# Patient Record
Sex: Female | Born: 1950
Health system: Southern US, Community
[De-identification: ages and names within clinical notes are randomized; demographics above are authoritative.]

## PROBLEM LIST (undated history)

## (undated) DIAGNOSIS — E079 Disorder of thyroid, unspecified: Secondary | ICD-10-CM

## (undated) DIAGNOSIS — F419 Anxiety disorder, unspecified: Secondary | ICD-10-CM

## (undated) DIAGNOSIS — E785 Hyperlipidemia, unspecified: Secondary | ICD-10-CM

## (undated) DIAGNOSIS — K219 Gastro-esophageal reflux disease without esophagitis: Secondary | ICD-10-CM

## (undated) DIAGNOSIS — T7840XA Allergy, unspecified, initial encounter: Secondary | ICD-10-CM

## (undated) HISTORY — DX: Disorder of thyroid, unspecified: E07.9

## (undated) HISTORY — PX: MANDIBLE FRACTURE SURGERY: SHX706

## (undated) HISTORY — DX: Allergy, unspecified, initial encounter: T78.40XA

## (undated) HISTORY — DX: Hyperlipidemia, unspecified: E78.5

## (undated) HISTORY — PX: ESOPHAGOGASTRODUODENOSCOPY: SHX1529

## (undated) HISTORY — DX: Gastro-esophageal reflux disease without esophagitis: K21.9

## (undated) HISTORY — DX: Anxiety disorder, unspecified: F41.9

---

## 2008-06-13 ENCOUNTER — Ambulatory Visit (HOSPITAL_COMMUNITY): Admission: RE | Admit: 2008-06-13 | Discharge: 2008-06-13 | Payer: Self-pay | Admitting: Family Medicine

## 2010-04-22 ENCOUNTER — Ambulatory Visit (HOSPITAL_COMMUNITY)
Admission: RE | Admit: 2010-04-22 | Discharge: 2010-04-22 | Payer: Self-pay | Source: Home / Self Care | Attending: Family Medicine | Admitting: Family Medicine

## 2010-05-04 ENCOUNTER — Ambulatory Visit
Admission: RE | Admit: 2010-05-04 | Discharge: 2010-05-04 | Payer: Self-pay | Source: Home / Self Care | Attending: Gastroenterology | Admitting: Gastroenterology

## 2010-05-04 ENCOUNTER — Encounter: Payer: Self-pay | Admitting: Internal Medicine

## 2010-05-04 ENCOUNTER — Encounter: Payer: Self-pay | Admitting: Gastroenterology

## 2010-05-04 DIAGNOSIS — K219 Gastro-esophageal reflux disease without esophagitis: Secondary | ICD-10-CM | POA: Insufficient documentation

## 2010-05-09 ENCOUNTER — Encounter: Payer: Self-pay | Admitting: Family Medicine

## 2010-05-10 ENCOUNTER — Encounter: Payer: Self-pay | Admitting: Gastroenterology

## 2010-05-12 ENCOUNTER — Encounter: Payer: Self-pay | Admitting: Gastroenterology

## 2010-05-17 ENCOUNTER — Ambulatory Visit (HOSPITAL_COMMUNITY)
Admission: RE | Admit: 2010-05-17 | Discharge: 2010-05-17 | Payer: Self-pay | Source: Home / Self Care | Attending: Internal Medicine | Admitting: Internal Medicine

## 2010-05-18 NOTE — Op Note (Signed)
NAME:  Courtney Tapia, BOEHNER               ACCOUNT NO.:  [id number]  MEDICAL RECORD NO.:  [id number]          PATIENT TYPE:  AMB  LOCATION:  DAY                           FACILITY:  [facility]  PHYSICIAN:  R. Roetta Sessions, M.D. DATE OF BIRTH:  May 24, 1950  DATE OF PROCEDURE:  05/17/2010 DATE OF DISCHARGE:                              OPERATIVE REPORT   PROCEDURE:  EGD with esophageal biopsy followed by colonoscopy with biopsy.  INDICATIONS FOR PROCEDURE:  A 60 year old lady with longstanding GERD, well-controlled on omeprazole 40 mg orally daily, had some recent epigastric pain which has resolved.  She was seen in office, started on Dexilant 60 mg orally daily.  She stated it did not last throughout the night, went back to omeprazole, was doing very well.  Epigastric pain is resolved.  History of Barrett's diagnosed back in [place].  Last EGD 2005.  No dysphagia, here for surveillance.  Also has never had screening colonoscopy.  No lower GI tract symptoms.  No family history of polyps, colon, rectal neoplasia.  EGD and colonoscopy now being done. Risks, benefits, limitations, alternatives, imponderables have been discussed.  Questions have been answered and all parties agreeable. Please see documentation in the medical record.  PROCEDURE NOTE:  O2 saturation, blood pressure, pulse, respirations were monitored the entirety of both procedures.  CONSCIOUS SEDATION:  Versed 6 mg IV, Demerol 150 mg IV in divided doses. Cetacaine spray for topical pharyngeal anesthesia.  EGD findings: Examination of tubular esophagus revealed salmon-colored epithelium coming up in "geographic manner" to 32 cm from the incisors and going down to 37 cm (EG junction).  There was no esophagitis, no tumor. Tubular esophagus was patent through the EG junction.  Stomach:  Gas cavity was emptied and insufflated well with air. Thorough examination of gastric mucosa including retroflexion of proximal stomach,  esophagogastric junction demonstrated only a small-to- moderate size hiatal hernia.  Pylorus was patent, easily traversed. Examination of bulb and second portion revealed no abnormalities.  THERAPEUTIC/DIAGNOSTIC MANEUVERS PERFORMED:  Scope was withdrawn. Several biopsies at 32, 35, and 37 cm incisors were taken for histologic study.  The patient tolerated the procedure and was prepared for colonoscopy.  Digital rectal exam revealed no abnormalities.  Endoscopic findings:  Prep was adequate.  Colon:  Colonic mucosa was surveyed from the rectosigmoid junction through the left transverse right colon to the appendiceal orifice, ileocecal valve/cecum.  These structures were well seen and photographed for the record.  From this level, the scope was slowly and cautiously withdrawn.  All previously mentioned mucosal surfaces were again seen.  The patient has scattered left-sided diverticular which was a single diminutive cecal polyp which was cold biopsied/removed.  Remainder of colonic mucosa appeared unremarkable. The scope was pulled down to the rectum where a thorough examination of rectal mucosa including retroflexed view of the anal verge demonstrated only some internal hemorrhoids.  The patient tolerated this procedure well.  Cecal withdrawal time 10 minutes.  IMPRESSION: 1. EGD salmon-colored epithelium in distal esophagus consistent with     prior diagnosis of Barrett's status post segmental biopsy. 2. Hiatal hernia, otherwise normal stomach, D1 and  D2.  COLONOSCOPY FINDINGS:  Internal hemorrhoids, otherwise normal rectum and left-sided diverticula.  Diminutive cecal polyp status post cold biopsy removal.  RECOMMENDATIONS: 1. Continue omeprazole 40 mg orally daily as this is working very well     for her reflux symptoms. 2. Follow up on path. 3. Literature on reflux, diverticulosis and polyps provided to Ms.     Thane.  Further recommendations to follow.     Jonathon Bellows, M.D.     RMR/MEDQ  D:  05/17/2010  T:  05/17/2010  Job:  [id number]  cc:   Angus G. Renard Matter, MD Fax: [phone]  Electronically Signed by Lorrin Goodell M.D. on 05/18/2010 01:42:39 PM

## 2010-05-19 ENCOUNTER — Encounter: Payer: Self-pay | Admitting: Internal Medicine
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
## 2010-05-20 NOTE — Letter (Signed)
Summary: AUTH FOR RELEASE OF MEDICAL INFO  AUTH FOR RELEASE OF MEDICAL INFO   Imported By: Rexene Alberts 05/10/2010 12:08:22  _____________________________________________________________________  External Attachment:    Type:   Image     Comment:   External Document

## 2010-05-20 NOTE — Letter (Signed)
Summary: EGD/TCS ORDER  EGD/TCS ORDER   Imported By: Ave Filter 05/04/2010 10:26:18  _____________________________________________________________________  External Attachment:    Type:   Image     Comment:   External Document

## 2010-05-20 NOTE — Letter (Signed)
Summary: [facility]  [facility]   Imported By: Rexene Alberts 05/12/2010 10:21:58  _____________________________________________________________________  External Attachment:    Type:   Image     Comment:   External Document

## 2010-05-20 NOTE — Letter (Signed)
Summary: REFERRAL FROM DR [facility]  REFERRAL FROM DR [facility]   Imported By: Rexene Alberts 05/04/2010 16:04:15  _____________________________________________________________________  External Attachment:    Type:   Image     Comment:   External Document

## 2010-05-26 NOTE — Letter (Addendum)
Summary: Patient Notice, Endo Biopsy Results  [facility]  [address]   [place], [place] [address]   Phone: [phone]  Fax: [phone]       May 19, 2010   Courtney Tapia [address] [place], [place]  [address] 12/13/1950    Dear Ms. Reiswig,  I am pleased to inform you that the biopsies taken during your recent endoscopic examination did not show any evidence of cancer upon pathologic examination.  You do have Barrett's esophagus.  The colon polyp was benign as well.  Additional information/recommendations:  No further action is needed at this time.  Please follow-up with your primary care physician for your other healthcare needs.  You should have a repeat endoscopic examination in 3 years.  You should have a repeat colonoscopy in 5 years.  Please call us if you are having persistent problems or have questions about your condition that have not been fully answered at this time.  Sincerely,    R. Roetta Sessions MD, FACP [facility] Ph: [phone]   Fax: [phone]   Appended Document: Patient Notice, Endo Biopsy Results mailed letter to pt  Appended Document: Patient Notice, Endo Biopsy Results reminder in epic

## 2011-05-10 ENCOUNTER — Other Ambulatory Visit (HOSPITAL_COMMUNITY): Payer: Self-pay | Admitting: Family Medicine

## 2011-05-10 DIAGNOSIS — Z139 Encounter for screening, unspecified: Secondary | ICD-10-CM

## 2011-05-13 ENCOUNTER — Ambulatory Visit (HOSPITAL_COMMUNITY)
Admission: RE | Admit: 2011-05-13 | Discharge: 2011-05-13 | Disposition: A | Discharge status: Home / Self Care | Payer: BC Managed Care – PPO | Source: Ambulatory Visit | Attending: Family Medicine | Admitting: Family Medicine

## 2011-05-13 DIAGNOSIS — Z1231 Encounter for screening mammogram for malignant neoplasm of breast: Secondary | ICD-10-CM | POA: Insufficient documentation

## 2011-05-13 DIAGNOSIS — Z139 Encounter for screening, unspecified: Secondary | ICD-10-CM

## 2012-04-16 ENCOUNTER — Other Ambulatory Visit (HOSPITAL_COMMUNITY): Payer: Self-pay | Admitting: Family Medicine

## 2012-04-16 DIAGNOSIS — Z139 Encounter for screening, unspecified: Secondary | ICD-10-CM

## 2012-05-15 ENCOUNTER — Ambulatory Visit (HOSPITAL_COMMUNITY)
Admission: RE | Admit: 2012-05-15 | Discharge: 2012-05-15 | Disposition: A | Discharge status: Home / Self Care | Payer: BC Managed Care – PPO | Source: Ambulatory Visit | Attending: Family Medicine | Admitting: Family Medicine

## 2012-05-15 DIAGNOSIS — Z1231 Encounter for screening mammogram for malignant neoplasm of breast: Secondary | ICD-10-CM | POA: Insufficient documentation

## 2012-05-15 DIAGNOSIS — Z139 Encounter for screening, unspecified: Secondary | ICD-10-CM

## 2013-05-03 ENCOUNTER — Other Ambulatory Visit (HOSPITAL_COMMUNITY): Payer: Self-pay | Admitting: Family Medicine

## 2013-05-03 DIAGNOSIS — Z139 Encounter for screening, unspecified: Secondary | ICD-10-CM

## 2013-05-17 ENCOUNTER — Ambulatory Visit (HOSPITAL_COMMUNITY)
Admission: RE | Admit: 2013-05-17 | Discharge: 2013-05-17 | Disposition: A | Discharge status: Home / Self Care | Payer: BC Managed Care – PPO | Source: Ambulatory Visit | Attending: Family Medicine | Admitting: Family Medicine

## 2013-05-17 ENCOUNTER — Other Ambulatory Visit (HOSPITAL_COMMUNITY): Payer: Self-pay | Admitting: Family Medicine

## 2013-05-17 DIAGNOSIS — M545 Low back pain, unspecified: Secondary | ICD-10-CM | POA: Insufficient documentation

## 2013-05-17 DIAGNOSIS — M79609 Pain in unspecified limb: Secondary | ICD-10-CM | POA: Insufficient documentation

## 2013-05-17 DIAGNOSIS — M79604 Pain in right leg: Secondary | ICD-10-CM

## 2013-05-17 DIAGNOSIS — M79605 Pain in left leg: Secondary | ICD-10-CM

## 2013-05-17 DIAGNOSIS — Z139 Encounter for screening, unspecified: Secondary | ICD-10-CM

## 2013-05-17 DIAGNOSIS — Z1231 Encounter for screening mammogram for malignant neoplasm of breast: Secondary | ICD-10-CM | POA: Insufficient documentation

## 2013-12-26 ENCOUNTER — Other Ambulatory Visit (HOSPITAL_COMMUNITY): Payer: Self-pay | Admitting: Family Medicine

## 2013-12-26 DIAGNOSIS — M810 Age-related osteoporosis without current pathological fracture: Secondary | ICD-10-CM

## 2013-12-31 ENCOUNTER — Other Ambulatory Visit (HOSPITAL_COMMUNITY): Payer: BC Managed Care – PPO

## 2014-07-24 ENCOUNTER — Other Ambulatory Visit (HOSPITAL_COMMUNITY): Payer: Self-pay | Admitting: Family Medicine

## 2014-07-24 DIAGNOSIS — Z1231 Encounter for screening mammogram for malignant neoplasm of breast: Secondary | ICD-10-CM

## 2014-08-06 ENCOUNTER — Ambulatory Visit (HOSPITAL_COMMUNITY): Payer: BC Managed Care – PPO

## 2014-09-19 ENCOUNTER — Ambulatory Visit (HOSPITAL_COMMUNITY)
Admission: RE | Admit: 2014-09-19 | Discharge: 2014-09-19 | Disposition: A | Discharge status: Home / Self Care | Payer: BC Managed Care – PPO | Source: Ambulatory Visit | Attending: Family Medicine | Admitting: Family Medicine

## 2014-09-19 DIAGNOSIS — Z1231 Encounter for screening mammogram for malignant neoplasm of breast: Secondary | ICD-10-CM | POA: Diagnosis not present

## 2015-05-12 ENCOUNTER — Encounter: Payer: Self-pay | Admitting: Internal Medicine

## 2015-06-30 ENCOUNTER — Encounter: Payer: Self-pay | Admitting: Family Medicine

## 2015-06-30 DIAGNOSIS — E039 Hypothyroidism, unspecified: Secondary | ICD-10-CM | POA: Insufficient documentation

## 2015-06-30 DIAGNOSIS — T7840XA Allergy, unspecified, initial encounter: Secondary | ICD-10-CM | POA: Insufficient documentation

## 2015-06-30 DIAGNOSIS — E785 Hyperlipidemia, unspecified: Secondary | ICD-10-CM | POA: Insufficient documentation

## 2015-06-30 DIAGNOSIS — F419 Anxiety disorder, unspecified: Secondary | ICD-10-CM | POA: Insufficient documentation

## 2015-07-07 ENCOUNTER — Ambulatory Visit: Payer: BC Managed Care – PPO | Admitting: Nurse Practitioner

## 2015-07-14 ENCOUNTER — Encounter: Payer: Self-pay | Admitting: Family Medicine

## 2015-07-14 ENCOUNTER — Ambulatory Visit (INDEPENDENT_AMBULATORY_CARE_PROVIDER_SITE_OTHER): Payer: BC Managed Care – PPO | Admitting: Family Medicine

## 2015-07-14 VITALS — BP 134/74 | HR 78 | Temp 98.2°F | Resp 16 | Ht 67.0 in | Wt 177.0 lb

## 2015-07-14 DIAGNOSIS — Z0000 Encounter for general adult medical examination without abnormal findings: Secondary | ICD-10-CM

## 2015-07-14 DIAGNOSIS — F419 Anxiety disorder, unspecified: Secondary | ICD-10-CM

## 2015-07-14 DIAGNOSIS — K219 Gastro-esophageal reflux disease without esophagitis: Secondary | ICD-10-CM

## 2015-07-14 DIAGNOSIS — Z13820 Encounter for screening for osteoporosis: Secondary | ICD-10-CM

## 2015-07-14 DIAGNOSIS — M25562 Pain in left knee: Secondary | ICD-10-CM | POA: Diagnosis not present

## 2015-07-14 DIAGNOSIS — R0989 Other specified symptoms and signs involving the circulatory and respiratory systems: Secondary | ICD-10-CM

## 2015-07-14 DIAGNOSIS — E785 Hyperlipidemia, unspecified: Secondary | ICD-10-CM

## 2015-07-14 DIAGNOSIS — Z1321 Encounter for screening for nutritional disorder: Secondary | ICD-10-CM

## 2015-07-14 DIAGNOSIS — G8929 Other chronic pain: Secondary | ICD-10-CM

## 2015-07-14 DIAGNOSIS — E038 Other specified hypothyroidism: Secondary | ICD-10-CM

## 2015-07-14 LAB — CBC WITH DIFFERENTIAL/PLATELET
BASOS ABS: 0 10*3/uL (ref 0.0–0.1)
Basophils Relative: 1 % (ref 0–1)
EOS PCT: 3 % (ref 0–5)
Eosinophils Absolute: 0.1 10*3/uL (ref 0.0–0.7)
HEMATOCRIT: 41.4 % (ref 36.0–46.0)
HEMOGLOBIN: 13.3 g/dL (ref 12.0–15.0)
LYMPHS ABS: 2 10*3/uL (ref 0.7–4.0)
LYMPHS PCT: 41 % (ref 12–46)
MCH: 30.2 pg (ref 26.0–34.0)
MCHC: 32.1 g/dL (ref 30.0–36.0)
MCV: 94.1 fL (ref 78.0–100.0)
MPV: 9.9 fL (ref 8.6–12.4)
Monocytes Absolute: 0.3 10*3/uL (ref 0.1–1.0)
Monocytes Relative: 6 % (ref 3–12)
NEUTROS ABS: 2.4 10*3/uL (ref 1.7–7.7)
NEUTROS PCT: 49 % (ref 43–77)
Platelets: 302 10*3/uL (ref 150–400)
RBC: 4.4 MIL/uL (ref 3.87–5.11)
RDW: 12.8 % (ref 11.5–15.5)
WBC: 4.9 10*3/uL (ref 4.0–10.5)

## 2015-07-14 LAB — COMPREHENSIVE METABOLIC PANEL
ALBUMIN: 4.2 g/dL (ref 3.6–5.1)
ALK PHOS: 72 U/L (ref 33–130)
ALT: 15 U/L (ref 6–29)
AST: 15 U/L (ref 10–35)
BILIRUBIN TOTAL: 0.8 mg/dL (ref 0.2–1.2)
BUN: 15 mg/dL (ref 7–25)
CALCIUM: 9.1 mg/dL (ref 8.6–10.4)
CO2: 27 mmol/L (ref 20–31)
Chloride: 105 mmol/L (ref 98–110)
Creat: 0.46 mg/dL — ABNORMAL LOW (ref 0.50–0.99)
GLUCOSE: 78 mg/dL (ref 70–99)
POTASSIUM: 4.3 mmol/L (ref 3.5–5.3)
Sodium: 143 mmol/L (ref 135–146)
TOTAL PROTEIN: 7.1 g/dL (ref 6.1–8.1)

## 2015-07-14 LAB — LIPID PANEL
CHOL/HDL RATIO: 2.9 ratio (ref <=5.0)
CHOLESTEROL: 224 mg/dL — AB (ref 125–200)
HDL: 77 mg/dL (ref >=46)
LDL Cholesterol: 133 mg/dL — ABNORMAL HIGH (ref <130)
Triglycerides: 71 mg/dL (ref <150)
VLDL: 14 mg/dL (ref <30)

## 2015-07-14 LAB — T4, FREE: Free T4: 1.3 ng/dL (ref 0.8–1.8)

## 2015-07-14 LAB — TSH: TSH: 3.05 m[IU]/L

## 2015-07-14 LAB — T3, FREE: T3 FREE: 3.1 pg/mL (ref 2.3–4.2)
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
## 2015-07-14 NOTE — Assessment & Plan Note (Signed)
Check thyroid function studies  

## 2015-07-14 NOTE — Assessment & Plan Note (Signed)
-

## 2015-07-14 NOTE — Assessment & Plan Note (Signed)
Continue Prilosec 20 mg.

## 2015-07-14 NOTE — Patient Instructions (Addendum)
Schedule your Bone Density  Continue current medications Referral to orthopedics for knee  We will call with lab results  Bruit on left carotid - Ultrasound to be done to evaluate  F/U pending results

## 2015-07-15 ENCOUNTER — Telehealth: Payer: Self-pay | Admitting: *Deleted

## 2015-07-15 ENCOUNTER — Encounter: Payer: Self-pay | Admitting: *Deleted

## 2015-07-15 ENCOUNTER — Other Ambulatory Visit: Payer: Self-pay | Admitting: *Deleted

## 2015-07-15 LAB — VITAMIN D 25 HYDROXY (VIT D DEFICIENCY, FRACTURES): VIT D 25 HYDROXY: 38 ng/mL (ref 30–100)

## 2015-07-15 MED ORDER — SIMVASTATIN 20 MG PO TABS: 20.0000 mg | ORAL_TABLET | Freq: Every day | ORAL | Status: DC

## 2015-07-15 NOTE — Telephone Encounter (Signed)
Pt called back and aware of appt 

## 2015-07-15 NOTE — Telephone Encounter (Signed)
Pt has appt scheduled for Carotid US at [facility] at [address] on Tues April 4 at 10:15am, pt is to arrive 9:50am, [facility] to pt for appt information

## 2015-07-21 ENCOUNTER — Ambulatory Visit
Admission: RE | Admit: 2015-07-21 | Discharge: 2015-07-21 | Disposition: A | Discharge status: Home / Self Care | Payer: BC Managed Care – PPO | Source: Ambulatory Visit | Attending: Family Medicine | Admitting: Family Medicine

## 2015-07-21 DIAGNOSIS — R0989 Other specified symptoms and signs involving the circulatory and respiratory systems: Secondary | ICD-10-CM

## 2015-07-23 ENCOUNTER — Other Ambulatory Visit: Payer: Self-pay | Admitting: Family Medicine

## 2015-07-23 ENCOUNTER — Encounter: Payer: Self-pay | Admitting: *Deleted

## 2015-07-23 DIAGNOSIS — E2839 Other primary ovarian failure: Secondary | ICD-10-CM

## 2015-08-04 ENCOUNTER — Telehealth: Payer: Self-pay | Admitting: Family Medicine

## 2015-08-04 MED ORDER — PRAVASTATIN SODIUM 20 MG PO TABS: 20.0000 mg | ORAL_TABLET | Freq: Every day | ORAL | Status: DC

## 2015-08-04 NOTE — Telephone Encounter (Signed)
We can try a weaker statin that has less SE of leg cramping Pravastatin 20mg  at bedtime

## 2015-08-04 NOTE — Telephone Encounter (Signed)
Have left message for patient to call back.  New Rx to pharmacy

## 2015-08-04 NOTE — Telephone Encounter (Signed)
Pt came to office at end of day.  Brought her Simvastatin 20 mg tablets.  Stated after she started these got severe cramping in legs.  Has stopped taking.  Wants to know what else you want to do or is there some sort of OTC herbal treatment she can use?  Pleased advise?

## 2015-08-04 NOTE — Telephone Encounter (Signed)
Pt aware of med change.  Call if any problems with new med

## 2015-08-07 ENCOUNTER — Other Ambulatory Visit: Payer: BC Managed Care – PPO

## 2015-09-17 ENCOUNTER — Telehealth: Payer: Self-pay | Admitting: Family Medicine

## 2015-09-17 DIAGNOSIS — M545 Low back pain: Secondary | ICD-10-CM

## 2015-09-17 NOTE — Telephone Encounter (Signed)
Wants second opinion referral to [facility] for back/knee pain.  Does not feel [facility] is taking good care of her.  Told her will need to have all records sent to [facility] before appt can be made.

## 2015-09-18 ENCOUNTER — Other Ambulatory Visit: Payer: Self-pay | Admitting: Orthopedic Surgery

## 2015-09-18 ENCOUNTER — Other Ambulatory Visit: Payer: Self-pay | Admitting: Family Medicine

## 2015-09-18 DIAGNOSIS — M545 Low back pain: Secondary | ICD-10-CM

## 2015-09-18 MED ORDER — ALPRAZOLAM 1 MG PO TABS: 1.0000 mg | ORAL_TABLET | Freq: Every evening | ORAL | Status: DC | PRN

## 2015-09-18 NOTE — Telephone Encounter (Signed)
Okay to refill give 2 

## 2015-09-18 NOTE — Telephone Encounter (Signed)
New refill to this pharmacy.  No refill history.  OK refill?

## 2015-09-18 NOTE — Telephone Encounter (Signed)
rx called in

## 2015-09-21 ENCOUNTER — Other Ambulatory Visit: Payer: BC Managed Care – PPO

## 2015-09-25 ENCOUNTER — Ambulatory Visit
Admission: RE | Admit: 2015-09-25 | Discharge: 2015-09-25 | Disposition: A | Discharge status: Home / Self Care | Payer: BC Managed Care – PPO | Source: Ambulatory Visit | Attending: Orthopedic Surgery | Admitting: Orthopedic Surgery

## 2015-09-25 DIAGNOSIS — M545 Low back pain: Secondary | ICD-10-CM

## 2015-10-19 ENCOUNTER — Other Ambulatory Visit: Payer: Self-pay | Admitting: Family Medicine

## 2015-10-19 ENCOUNTER — Telehealth: Payer: Self-pay | Admitting: Family Medicine

## 2015-10-19 MED ORDER — LEVOTHYROXINE SODIUM 25 MCG PO TABS: 25.0000 ug | ORAL_TABLET | Freq: Every day | ORAL | Status: DC

## 2015-10-19 NOTE — Telephone Encounter (Signed)
Refill appropriate and filled per protocol. 

## 2015-10-19 NOTE — Telephone Encounter (Signed)
Pt is requesting a 90 day supply of Levothyroxine 25 mg [facility]

## 2015-11-17 ENCOUNTER — Other Ambulatory Visit: Payer: Self-pay | Admitting: Family Medicine

## 2015-11-17 DIAGNOSIS — Z1231 Encounter for screening mammogram for malignant neoplasm of breast: Secondary | ICD-10-CM

## 2015-11-19 ENCOUNTER — Ambulatory Visit (HOSPITAL_COMMUNITY)
Admission: RE | Admit: 2015-11-19 | Discharge: 2015-11-19 | Disposition: A | Discharge status: Home / Self Care | Payer: BC Managed Care – PPO | Source: Ambulatory Visit | Attending: Family Medicine | Admitting: Family Medicine

## 2015-11-19 DIAGNOSIS — Z1231 Encounter for screening mammogram for malignant neoplasm of breast: Secondary | ICD-10-CM

## 2016-03-15 ENCOUNTER — Other Ambulatory Visit: Payer: Self-pay | Admitting: Family Medicine

## 2016-03-16 NOTE — Telephone Encounter (Signed)
Last Ov 07-14-15 Last refill 09-18-15 Ok to refill?

## 2016-03-16 NOTE — Telephone Encounter (Signed)
okay

## 2016-03-16 NOTE — Telephone Encounter (Signed)
Rx called in 

## 2016-04-05 ENCOUNTER — Other Ambulatory Visit: Payer: Self-pay | Admitting: Family Medicine

## 2016-09-30 ENCOUNTER — Other Ambulatory Visit: Payer: Self-pay | Admitting: Family Medicine

## 2016-09-30 NOTE — Telephone Encounter (Signed)
Medication filled x1 with no refills.   Requires office visit before any further refills can be given.   Letter sent.  

## 2016-09-30 NOTE — Telephone Encounter (Signed)
gIVE 1 REFILL, she has not been seen in > 1 year, needs OV this month

## 2016-09-30 NOTE — Telephone Encounter (Signed)
Ok to refill??  Last office visit 07/13/2015.  Last refill 03/16/2017, #2 refills.

## 2016-10-11 ENCOUNTER — Encounter: Payer: Self-pay | Admitting: *Deleted

## 2016-11-01 ENCOUNTER — Other Ambulatory Visit: Payer: Self-pay | Admitting: Family Medicine

## 2016-11-09 ENCOUNTER — Encounter: Payer: Self-pay | Admitting: Family Medicine

## 2016-11-09 ENCOUNTER — Ambulatory Visit (INDEPENDENT_AMBULATORY_CARE_PROVIDER_SITE_OTHER): Payer: BC Managed Care – PPO | Admitting: Family Medicine

## 2016-11-09 VITALS — BP 118/62 | HR 76 | Temp 97.8°F | Resp 14 | Ht 67.0 in | Wt 178.0 lb

## 2016-11-09 DIAGNOSIS — E782 Mixed hyperlipidemia: Secondary | ICD-10-CM | POA: Diagnosis not present

## 2016-11-09 DIAGNOSIS — F419 Anxiety disorder, unspecified: Secondary | ICD-10-CM | POA: Diagnosis not present

## 2016-11-09 DIAGNOSIS — E038 Other specified hypothyroidism: Secondary | ICD-10-CM | POA: Diagnosis not present

## 2016-11-09 DIAGNOSIS — K219 Gastro-esophageal reflux disease without esophagitis: Secondary | ICD-10-CM

## 2016-11-09 LAB — CBC WITH DIFFERENTIAL/PLATELET
BASOS PCT: 1 %
Basophils Absolute: 55 cells/uL (ref 0–200)
EOS PCT: 3 %
Eosinophils Absolute: 165 cells/uL (ref 15–500)
HCT: 42.6 % (ref 35.0–45.0)
HEMOGLOBIN: 13.7 g/dL (ref 12.0–15.0)
LYMPHS ABS: 1980 {cells}/uL (ref 850–3900)
Lymphocytes Relative: 36 %
MCH: 30.7 pg (ref 27.0–33.0)
MCHC: 32.2 g/dL (ref 32.0–36.0)
MCV: 95.5 fL (ref 80.0–100.0)
MONO ABS: 495 {cells}/uL (ref 200–950)
MPV: 9.7 fL (ref 7.5–12.5)
Monocytes Relative: 9 %
NEUTROS ABS: 2805 {cells}/uL (ref 1500–7800)
Neutrophils Relative %: 51 %
Platelets: 308 10*3/uL (ref 140–400)
RBC: 4.46 MIL/uL (ref 3.80–5.10)
RDW: 13.4 % (ref 11.0–15.0)
WBC: 5.5 10*3/uL (ref 3.8–10.8)

## 2016-11-09 LAB — COMPREHENSIVE METABOLIC PANEL
ALBUMIN: 4.3 g/dL (ref 3.6–5.1)
ALK PHOS: 81 U/L (ref 33–130)
ALT: 14 U/L (ref 6–29)
AST: 14 U/L (ref 10–35)
BILIRUBIN TOTAL: 0.8 mg/dL (ref 0.2–1.2)
BUN: 20 mg/dL (ref 7–25)
CO2: 21 mmol/L (ref 20–31)
CREATININE: 0.7 mg/dL (ref 0.50–0.99)
Calcium: 9.4 mg/dL (ref 8.6–10.4)
Chloride: 104 mmol/L (ref 98–110)
GLUCOSE: 85 mg/dL (ref 70–99)
Potassium: 4.4 mmol/L (ref 3.5–5.3)
SODIUM: 138 mmol/L (ref 135–146)
Total Protein: 7 g/dL (ref 6.1–8.1)

## 2016-11-09 LAB — TSH: TSH: 4 m[IU]/L

## 2016-11-09 LAB — LIPID PANEL
Cholesterol: 229 mg/dL — ABNORMAL HIGH (ref <200)
HDL: 82 mg/dL (ref >50)
LDL CALC: 130 mg/dL — AB (ref <100)
TRIGLYCERIDES: 84 mg/dL (ref <150)
Total CHOL/HDL Ratio: 2.8 Ratio (ref <5.0)
VLDL: 17 mg/dL (ref <30)

## 2016-11-09 LAB — T4, FREE: FREE T4: 1.3 ng/dL (ref 0.8–1.8)

## 2016-11-09 LAB — T3, FREE: T3, Free: 3.1 pg/mL (ref 2.3–4.2)

## 2016-11-09 MED ORDER — LEVOTHYROXINE SODIUM 25 MCG PO TABS: ORAL_TABLET | ORAL | 1 refills | Status: DC

## 2016-11-09 MED ORDER — OMEPRAZOLE 20 MG PO CPDR: 20.0000 mg | DELAYED_RELEASE_CAPSULE | Freq: Every day | ORAL | 2 refills | Status: DC

## 2016-11-09 MED ORDER — ALPRAZOLAM 1 MG PO TABS: 1.0000 mg | ORAL_TABLET | Freq: Two times a day (BID) | ORAL | 1 refills | Status: DC | PRN

## 2016-11-09 NOTE — Assessment & Plan Note (Addendum)
Husband going through cancer treatments Continue xanax, discussed we could also add lexapro at bedtime, she's not want to add anything else at this time. Note I did give her 60 tablets per month just to help with going back and forth to the pharmacy. She typically only takes once a day

## 2016-11-09 NOTE — Assessment & Plan Note (Signed)
Recheck fasting labs she has changed some things with her diet increasing fiber as well.

## 2016-11-09 NOTE — Progress Notes (Signed)
   Subjective:    Patient ID: Courtney Tapia, female    DOB: 05-17-1950, 66 y.o.   MRN: [id number]  Patient presents for Medication Review/ refill (is fasting)  Patient here to follow chronic medical problems. Medications reviewed.  Hypothyroidism she is taking her thyroid medication as prescribed daily no symptoms of any thyroid abnormalities She's history of mild hyperlipidemia she takes fish oil f, she tried zocor/pravadstatin had symptoms with both States that she felt achy all over. she is due for repeat lipid panel. She also has mild carotid artery disease noted on carotid ultrasound last year.  She does continue to use her Xanax to help her sleep at night. Her husband's diagnosed with esophageal cancer is currently going through radiation I will have surgery. She does have a support system. 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   Review Of Systems:  GEN- denies fatigue, fever, weight loss,weakness, recent illness HEENT- denies eye drainage, change in vision, nasal discharge, CVS- denies chest pain, palpitations RESP- denies SOB, cough, wheeze ABD- denies N/V, change in stools, abd pain GU- denies dysuria, hematuria, dribbling, incontinence MSK- denies joint pain, muscle aches, injury Neuro- denies headache, dizziness, syncope, seizure activity       Objective:    BP 118/62   Pulse 76   Temp 97.8 F (36.6 C) (Oral)   Resp 14   Ht 5\' 7"  (1.702 m)   Wt 178 lb (80.7 kg)   SpO2 99%   BMI 27.88 kg/m  GEN- NAD, alert and oriented x3 HEENT- PERRL, EOMI, non injected sclera, Tapia conjunctiva, MMM, oropharynx clear Neck- Supple, no thyromegaly, no bruit  CVS- RRR, no murmur RESP-CTAB ABD-NABS,soft,NT,ND Psych- normal affect and mood  EXT- No edema Pulses- Radial, DP- 2+        Assessment & Plan:      Problem List Items Addressed This Visit    GERD   Relevant Medications   omeprazole (PRILOSEC) 20 MG capsule   Hypothyroidism    Repeat TSH , adjust dose as needed      Relevant Medications   levothyroxine (SYNTHROID, LEVOTHROID) 25 MCG tablet   Other Relevant Orders   TSH   T3, free   T4, free   Hyperlipidemia - Primary    Recheck fasting labs she has changed some things with her diet increasing fiber as well.      Relevant Orders   CBC with Differential/Platelet   Comprehensive metabolic panel   Lipid panel   Anxiety    Husband going through cancer treatments Continue xanax, discussed we could also add lexapro at bedtime, she's not want to add anything else at this time. Note I did give her 60 tablets per month just to help with going back and forth to the pharmacy. She typically only takes once a day      Relevant Medications   ALPRAZolam (XANAX) 1 MG tablet      Note: This dictation was prepared with Dragon dictation along with smaller phrase technology. Any transcriptional errors that result from this process are unintentional.

## 2016-11-09 NOTE — Assessment & Plan Note (Signed)
Repeat TSH , adjust dose as needed

## 2016-11-09 NOTE — Patient Instructions (Signed)
We will call with lab results  Schedule a physical in the Winter

## 2016-11-11 MED ORDER — LEVOTHYROXINE SODIUM 25 MCG PO TABS: ORAL_TABLET | ORAL | 3 refills | Status: DC

## 2016-11-11 NOTE — Addendum Note (Signed)
Addended by: Vic Blackbird F on: 11/11/2016 01:33 PM   Modules accepted: Orders

## 2016-11-30 ENCOUNTER — Other Ambulatory Visit: Payer: Self-pay | Admitting: Family Medicine

## 2016-11-30 DIAGNOSIS — Z1231 Encounter for screening mammogram for malignant neoplasm of breast: Secondary | ICD-10-CM

## 2016-12-01 ENCOUNTER — Ambulatory Visit (HOSPITAL_COMMUNITY)
Admission: RE | Admit: 2016-12-01 | Discharge: 2016-12-01 | Disposition: A | Discharge status: Home / Self Care | Payer: BC Managed Care – PPO | Source: Ambulatory Visit | Attending: Family Medicine | Admitting: Family Medicine

## 2016-12-01 DIAGNOSIS — Z1231 Encounter for screening mammogram for malignant neoplasm of breast: Secondary | ICD-10-CM | POA: Diagnosis present

## 2017-03-20 ENCOUNTER — Other Ambulatory Visit: Payer: Self-pay | Admitting: Family Medicine

## 2017-03-20 NOTE — Telephone Encounter (Signed)
Ok to refill??  Last office visit/ refill 11/09/2016, #1 refills.

## 2017-03-20 NOTE — Telephone Encounter (Signed)
Medication called to pharmacy. 

## 2017-03-20 NOTE — Telephone Encounter (Signed)
Okay to refill? 

## 2017-07-26 ENCOUNTER — Other Ambulatory Visit: Payer: Self-pay | Admitting: Family Medicine

## 2017-11-12 ENCOUNTER — Other Ambulatory Visit: Payer: Self-pay | Admitting: Family Medicine

## 2018-01-01 DIAGNOSIS — R69 Illness, unspecified: Secondary | ICD-10-CM | POA: Diagnosis not present

## 2018-01-15 ENCOUNTER — Other Ambulatory Visit: Payer: Self-pay | Admitting: Family Medicine

## 2018-01-15 DIAGNOSIS — Z1231 Encounter for screening mammogram for malignant neoplasm of breast: Secondary | ICD-10-CM

## 2018-01-17 ENCOUNTER — Encounter: Payer: Self-pay | Admitting: Family Medicine

## 2018-01-17 ENCOUNTER — Ambulatory Visit (INDEPENDENT_AMBULATORY_CARE_PROVIDER_SITE_OTHER): Payer: Medicare HMO | Admitting: Family Medicine

## 2018-01-17 VITALS — BP 118/64 | HR 82 | Temp 98.1°F | Resp 14 | Ht 64.96 in | Wt 174.0 lb

## 2018-01-17 DIAGNOSIS — Z23 Encounter for immunization: Secondary | ICD-10-CM | POA: Diagnosis not present

## 2018-01-17 DIAGNOSIS — Z0000 Encounter for general adult medical examination without abnormal findings: Secondary | ICD-10-CM | POA: Diagnosis not present

## 2018-01-17 DIAGNOSIS — Z114 Encounter for screening for human immunodeficiency virus [HIV]: Secondary | ICD-10-CM | POA: Diagnosis not present

## 2018-01-17 DIAGNOSIS — E038 Other specified hypothyroidism: Secondary | ICD-10-CM

## 2018-01-17 DIAGNOSIS — R7309 Other abnormal glucose: Secondary | ICD-10-CM | POA: Diagnosis not present

## 2018-01-17 DIAGNOSIS — Z780 Asymptomatic menopausal state: Secondary | ICD-10-CM

## 2018-01-17 DIAGNOSIS — Z1159 Encounter for screening for other viral diseases: Secondary | ICD-10-CM | POA: Diagnosis not present

## 2018-01-17 DIAGNOSIS — E782 Mixed hyperlipidemia: Secondary | ICD-10-CM | POA: Diagnosis not present

## 2018-01-17 DIAGNOSIS — R69 Illness, unspecified: Secondary | ICD-10-CM | POA: Diagnosis not present

## 2018-01-17 MED ORDER — ALPRAZOLAM 1 MG PO TABS: ORAL_TABLET | ORAL | 1 refills | Status: DC
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
## 2018-01-17 NOTE — Patient Instructions (Addendum)
Return for PAP   Ms. Bencomo , Thank you for taking time to come for your Medicare Wellness Visit. I appreciate your ongoing commitment to your health goals. Please review the following plan we discussed and let me know if I can assist you in the future.   These are the goals we discussed: Goals   None       This is a list of the screening recommended for you and due dates:  Health Maintenance  Topic Date Due  .  Hepatitis C: One time screening is recommended by Center for Disease Control  (CDC) for  adults born from [birth year] through 1963.   [birth year]  . HIV Screening  11/30/1967  . Tetanus Vaccine  11/30/1971  . Pap Smear  11/29/1973  . Colon Cancer Screening  05/20/2015  . Flu Shot  11/16/2017  . DEXA scan (bone density measurement)  11/29/2017  . Pneumonia vaccines (2 of 2 - PPSV23) 11/29/2017  . Mammogram  12/02/2018

## 2018-01-18 LAB — HIV ANTIBODY (ROUTINE TESTING W REFLEX): HIV: NONREACTIVE

## 2018-01-18 LAB — HEPATITIS C ANTIBODY
HEP C AB: NONREACTIVE
SIGNAL TO CUT-OFF: 0.01 (ref <1.00)

## 2018-01-18 LAB — CBC WITH DIFFERENTIAL/PLATELET
BASOS ABS: 40 {cells}/uL (ref 0–200)
Basophils Relative: 0.7 %
EOS ABS: 228 {cells}/uL (ref 15–500)
Eosinophils Relative: 4 %
HEMATOCRIT: 38.3 % (ref 35.0–45.0)
Hemoglobin: 12.4 g/dL (ref 11.7–15.5)
LYMPHS ABS: 2092 {cells}/uL (ref 850–3900)
MCH: 30.2 pg (ref 27.0–33.0)
MCHC: 32.4 g/dL (ref 32.0–36.0)
MCV: 93.4 fL (ref 80.0–100.0)
MPV: 10.2 fL (ref 7.5–12.5)
Monocytes Relative: 10.4 %
NEUTROS PCT: 48.2 %
Neutro Abs: 2747 cells/uL (ref 1500–7800)
Platelets: 309 10*3/uL (ref 140–400)
RBC: 4.1 10*6/uL (ref 3.80–5.10)
RDW: 11.8 % (ref 11.0–15.0)
Total Lymphocyte: 36.7 %
WBC: 5.7 10*3/uL (ref 3.8–10.8)
WBCMIX: 593 {cells}/uL (ref 200–950)

## 2018-01-18 LAB — COMPLETE METABOLIC PANEL WITH GFR
AG RATIO: 1.6 (calc) (ref 1.0–2.5)
ALT: 12 U/L (ref 6–29)
AST: 12 U/L (ref 10–35)
Albumin: 4.2 g/dL (ref 3.6–5.1)
Alkaline phosphatase (APISO): 91 U/L (ref 33–130)
BILIRUBIN TOTAL: 0.6 mg/dL (ref 0.2–1.2)
BUN: 17 mg/dL (ref 7–25)
CALCIUM: 9.3 mg/dL (ref 8.6–10.4)
CHLORIDE: 104 mmol/L (ref 98–110)
CO2: 25 mmol/L (ref 20–32)
Creat: 0.74 mg/dL (ref 0.50–0.99)
GFR, EST NON AFRICAN AMERICAN: 85 mL/min/{1.73_m2} (ref >=60)
GFR, Est African American: 99 mL/min/{1.73_m2} (ref >=60)
Globulin: 2.6 g/dL (calc) (ref 1.9–3.7)
Glucose, Bld: 92 mg/dL (ref 65–99)
POTASSIUM: 4.5 mmol/L (ref 3.5–5.3)
Sodium: 140 mmol/L (ref 135–146)
Total Protein: 6.8 g/dL (ref 6.1–8.1)

## 2018-01-18 LAB — HEMOGLOBIN A1C
EAG (MMOL/L): 5.8 (calc)
HEMOGLOBIN A1C: 5.3 %{Hb} (ref <5.7)
MEAN PLASMA GLUCOSE: 105 (calc)

## 2018-01-18 LAB — LIPID PANEL
CHOLESTEROL: 218 mg/dL — AB (ref <200)
HDL: 72 mg/dL (ref >50)
LDL Cholesterol (Calc): 124 mg/dL (calc) — ABNORMAL HIGH
Non-HDL Cholesterol (Calc): 146 mg/dL (calc) — ABNORMAL HIGH (ref <130)
TRIGLYCERIDES: 114 mg/dL (ref <150)
Total CHOL/HDL Ratio: 3 (calc) (ref <5.0)

## 2018-01-18 LAB — TSH: TSH: 2.8 mIU/L (ref 0.40–4.50)

## 2018-01-19 ENCOUNTER — Ambulatory Visit (HOSPITAL_COMMUNITY)
Admission: RE | Admit: 2018-01-19 | Discharge: 2018-01-19 | Disposition: A | Discharge status: Home / Self Care | Payer: Medicare HMO | Source: Ambulatory Visit | Attending: Family Medicine | Admitting: Family Medicine

## 2018-01-19 DIAGNOSIS — Z1231 Encounter for screening mammogram for malignant neoplasm of breast: Secondary | ICD-10-CM | POA: Diagnosis not present

## 2018-02-07 ENCOUNTER — Other Ambulatory Visit: Payer: Self-pay | Admitting: Family Medicine

## 2018-03-23 ENCOUNTER — Other Ambulatory Visit: Payer: Medicare HMO

## 2018-05-02 ENCOUNTER — Other Ambulatory Visit: Payer: Self-pay | Admitting: Family Medicine

## 2018-05-08 ENCOUNTER — Other Ambulatory Visit: Payer: Medicare HMO

## 2018-05-27 ENCOUNTER — Other Ambulatory Visit: Payer: Self-pay | Admitting: Family Medicine

## 2018-07-09 ENCOUNTER — Other Ambulatory Visit: Payer: Self-pay | Admitting: Family Medicine

## 2018-07-09 MED ORDER — ALPRAZOLAM 1 MG PO TABS: ORAL_TABLET | ORAL | 1 refills | Status: DC

## 2018-07-09 NOTE — Telephone Encounter (Signed)
Last office visit: 01/17/2018 Last refill: 01/17/2018

## 2018-07-20 ENCOUNTER — Ambulatory Visit: Payer: Medicare HMO | Admitting: Family Medicine

## 2018-07-29 ENCOUNTER — Other Ambulatory Visit: Payer: Self-pay | Admitting: Family Medicine

## 2018-09-18 ENCOUNTER — Encounter: Payer: Self-pay | Admitting: Family Medicine

## 2018-09-18 ENCOUNTER — Other Ambulatory Visit: Payer: Self-pay

## 2018-09-18 ENCOUNTER — Ambulatory Visit (INDEPENDENT_AMBULATORY_CARE_PROVIDER_SITE_OTHER): Payer: Medicare HMO | Admitting: Family Medicine

## 2018-09-18 VITALS — BP 140/74 | HR 65 | Temp 98.5°F | Resp 16 | Ht 67.0 in | Wt 171.8 lb

## 2018-09-18 DIAGNOSIS — K219 Gastro-esophageal reflux disease without esophagitis: Secondary | ICD-10-CM | POA: Diagnosis not present

## 2018-09-18 DIAGNOSIS — E038 Other specified hypothyroidism: Secondary | ICD-10-CM | POA: Diagnosis not present

## 2018-09-18 DIAGNOSIS — F419 Anxiety disorder, unspecified: Secondary | ICD-10-CM | POA: Diagnosis not present

## 2018-09-18 DIAGNOSIS — E782 Mixed hyperlipidemia: Secondary | ICD-10-CM | POA: Diagnosis not present

## 2018-09-18 DIAGNOSIS — R69 Illness, unspecified: Secondary | ICD-10-CM | POA: Diagnosis not present

## 2018-09-18 NOTE — Assessment & Plan Note (Signed)
No change to meds, controlled

## 2018-09-18 NOTE — Assessment & Plan Note (Signed)
On fish oil, recheck lipids

## 2018-09-18 NOTE — Assessment & Plan Note (Signed)
Recheck TFT, continue thyroid placement

## 2018-09-18 NOTE — Progress Notes (Signed)
   Subjective:    Patient ID: Courtney Tapia, female    DOB: 1950-05-22, 68 y.o.   MRN: [id number]  Patient presents for Follow-up Patient here to follow-up chronic medical problems.  Medications reviewed.  HyppThyroidism she is taking levothyroxine 25 mcg once a day-function studies were normal back in November  Acid reflux she is taking omeprazole 20 mg once a day which she continues to benefit from  Anxiety- husband died 3 weeks ago, from metastatic esophogeal cancer. Hospice was involved. Children assist her. Her joy now is helping with new grand-baby Xanax does help with sleep     Hyperlipidemia she has been working on dietary changes due for repeat fasting lipid, taking fish oil daily   Review Of Systems:  GEN- denies fatigue, fever, weight loss,weakness, recent illness HEENT- denies eye drainage, change in vision, nasal discharge, CVS- denies chest pain, palpitations RESP- denies SOB, cough, wheeze ABD- denies N/V, change in stools, abd pain GU- denies dysuria, hematuria, dribbling, incontinence MSK- denies joint pain, muscle aches, injury Neuro- denies headache, dizziness, syncope, seizure activity       Objective:    BP 140/74   Pulse 65   Temp 98.5 F (36.9 C)   Resp 16   Ht 5\' 7"  (1.702 m)   Wt 171 lb 12.8 oz (77.9 kg)   SpO2 100%   BMI 26.91 kg/m  GEN- NAD, alert and oriented x3 HEENT- PERRL, EOMI, non injected sclera, Tapia conjunctiva, MMM, oropharynx clear Neck- Supple, no thyromegaly CVS- RRR, no murmur RESP-CTAB ABD-NABS,soft,NT,ND Psych- normal affect and mood, very pleasant  EXT- No edema Pulses- Radial, DP- 2+        Assessment & Plan:      Problem List Items Addressed This Visit      Unprioritized   Anxiety - Primary    Stressors associated with grief, has supportive family Continue prn xanax Reach out if anything needed      GERD    No change to meds, controlled      Hyperlipidemia    On fish oil, recheck lipids      Relevant Orders   Lipid panel   Comprehensive metabolic panel   Hypothyroidism    Recheck TFT, continue thyroid placement      Relevant Orders   TSH   T3, free      Note: This dictation was prepared with Dragon dictation along with smaller phrase technology. Any transcriptional errors that result from this process are unintentional.

## 2018-09-18 NOTE — Patient Instructions (Addendum)
F/U  End of October for Physical

## 2018-09-18 NOTE — Assessment & Plan Note (Signed)
Stressors associated with grief, has supportive family Continue prn xanax Reach out if anything needed

## 2018-09-19 LAB — LIPID PANEL
Cholesterol: 219 mg/dL — ABNORMAL HIGH (ref <200)
HDL: 92 mg/dL (ref >=50)
LDL Cholesterol (Calc): 112 mg/dL (calc) — ABNORMAL HIGH
Non-HDL Cholesterol (Calc): 127 mg/dL (calc) (ref <130)
Total CHOL/HDL Ratio: 2.4 (calc) (ref <5.0)
Triglycerides: 66 mg/dL (ref <150)

## 2018-09-19 LAB — EXTRA LAV TOP TUBE

## 2018-09-19 LAB — COMPREHENSIVE METABOLIC PANEL
AG Ratio: 1.6 (calc) (ref 1.0–2.5)
ALT: 15 U/L (ref 6–29)
AST: 14 U/L (ref 10–35)
Albumin: 4.2 g/dL (ref 3.6–5.1)
Alkaline phosphatase (APISO): 79 U/L (ref 37–153)
BUN: 17 mg/dL (ref 7–25)
CO2: 25 mmol/L (ref 20–32)
Calcium: 9.6 mg/dL (ref 8.6–10.4)
Chloride: 105 mmol/L (ref 98–110)
Creat: 0.72 mg/dL (ref 0.50–0.99)
Globulin: 2.7 g/dL (calc) (ref 1.9–3.7)
Glucose, Bld: 92 mg/dL (ref 65–99)
Potassium: 4.6 mmol/L (ref 3.5–5.3)
Sodium: 140 mmol/L (ref 135–146)
Total Bilirubin: 0.9 mg/dL (ref 0.2–1.2)
Total Protein: 6.9 g/dL (ref 6.1–8.1)

## 2018-09-19 LAB — T3, FREE: T3, Free: 3 pg/mL (ref 2.3–4.2)

## 2018-09-19 LAB — TSH: TSH: 3.26 mIU/L (ref 0.40–4.50)

## 2018-10-04 DIAGNOSIS — R69 Illness, unspecified: Secondary | ICD-10-CM | POA: Diagnosis not present

## 2018-10-09 DIAGNOSIS — R69 Illness, unspecified: Secondary | ICD-10-CM | POA: Diagnosis not present

## 2018-10-22 ENCOUNTER — Other Ambulatory Visit: Payer: Self-pay | Admitting: Family Medicine

## 2018-12-27 ENCOUNTER — Telehealth: Payer: Self-pay | Admitting: *Deleted

## 2018-12-27 ENCOUNTER — Other Ambulatory Visit: Payer: Self-pay | Admitting: Family Medicine

## 2018-12-27 NOTE — Telephone Encounter (Signed)
Ok to refill??  Last office visit 09/18/2018.  Last refill 07/09/2018, #1 refill.

## 2018-12-27 NOTE — Telephone Encounter (Signed)
Received call from patient.   States that she has been having UTI Sx x4 days. Reports that she has been pushing cranberry juice and water. Also states that she has been taking over the counter Cystex with no real improvement.   Appointment scheduled.

## 2018-12-28 ENCOUNTER — Other Ambulatory Visit: Payer: Self-pay

## 2018-12-28 ENCOUNTER — Encounter: Payer: Self-pay | Admitting: Family Medicine

## 2018-12-28 ENCOUNTER — Ambulatory Visit (INDEPENDENT_AMBULATORY_CARE_PROVIDER_SITE_OTHER): Payer: Medicare HMO | Admitting: Family Medicine

## 2018-12-28 VITALS — BP 126/72 | HR 76 | Temp 98.1°F | Resp 14 | Ht 67.0 in | Wt 166.0 lb

## 2018-12-28 DIAGNOSIS — N390 Urinary tract infection, site not specified: Secondary | ICD-10-CM | POA: Diagnosis not present

## 2018-12-28 DIAGNOSIS — R69 Illness, unspecified: Secondary | ICD-10-CM | POA: Diagnosis not present

## 2018-12-28 DIAGNOSIS — F419 Anxiety disorder, unspecified: Secondary | ICD-10-CM | POA: Diagnosis not present

## 2018-12-28 DIAGNOSIS — R300 Dysuria: Secondary | ICD-10-CM | POA: Diagnosis not present

## 2018-12-28 LAB — URINALYSIS, ROUTINE W REFLEX MICROSCOPIC
Bilirubin Urine: NEGATIVE
Glucose, UA: NEGATIVE
Hgb urine dipstick: NEGATIVE
Hyaline Cast: NONE SEEN /LPF
Ketones, ur: NEGATIVE
Nitrite: NEGATIVE
Protein, ur: NEGATIVE
Specific Gravity, Urine: 1.02 (ref 1.001–1.03)
pH: 5.5 (ref 5.0–8.0)

## 2018-12-28 LAB — MICROSCOPIC MESSAGE

## 2018-12-28 MED ORDER — CEPHALEXIN 500 MG PO CAPS: 500.0000 mg | ORAL_CAPSULE | Freq: Three times a day (TID) | ORAL | 0 refills | Status: DC

## 2018-12-28 NOTE — Progress Notes (Signed)
   Subjective:    Patient ID: Courtney Tapia, female    DOB: Jul 10, 1950, 68 y.o.   MRN: [id number]  Patient presents for Dysuria  Pt here with dysuria for the past week. Over the past weekend was taking cystex OTC bladder irritant. No fever vomiting.  Urine orange from the cystex  No body aches and diarrhea   Low back discomfort      Review Of Systems:  GEN- denies fatigue, fever, weight loss,weakness, recent illness HEENT- denies eye drainage, change in vision, nasal discharge, CVS- denies chest pain, palpitations RESP- denies SOB, cough, wheeze ABD- denies N/V, change in stools, abd pain GU- + dysuria, hematuria, dribbling, incontinence MSK- denies joint pain, muscle aches, injury Neuro- denies headache, dizziness, syncope, seizure activity       Objective:    BP 126/72   Pulse 76   Temp 98.1 F (36.7 C) (Oral)   Resp 14   Ht 5\' 7"  (1.702 m)   Wt 166 lb (75.3 kg)   SpO2 97%   BMI 26.00 kg/m  GEN- NAD, alert and oriented x3 HEENT- PERRL, EOMI, non injected sclera, Tapia conjunctiva, MMM, oropharynx clear CVS- RRR, no murmur RESP-CTAB ABD-NABS,soft,NT,ND, no CVA tenderness EXT- No edema Pulses- Radial2+        Assessment & Plan:      Problem List Items Addressed This Visit      Unprioritized   Anxiety    We briefly discussed her use of ativan, uses most night to helps with sleep, anxiety since her husband died 4 months ago Discussed possible use of trazadone, she wants to think about it Would start  25mg  at bedtime, can go up to 50mg        Other Visit Diagnoses    Urinary tract infection without hematuria, site unspecified    -  Primary   culture sent, treat with keflex, benign abd exam   Relevant Medications   cephALEXin (KEFLEX) 500 MG capsule   Other Relevant Orders   Urinalysis, Routine w reflex microscopic (Completed)   Urine Culture (Completed)      Note: This dictation was prepared with Dragon dictation along with smaller phrase  technology. Any transcriptional errors that result from this process are unintentional.

## 2018-12-28 NOTE — Telephone Encounter (Signed)
noted 

## 2018-12-28 NOTE — Patient Instructions (Addendum)
F/U as needed  Consider Trazodone for sleep

## 2018-12-30 ENCOUNTER — Encounter: Payer: Self-pay | Admitting: Family Medicine

## 2018-12-30 LAB — URINE CULTURE
MICRO NUMBER:: 872789
Result:: NO GROWTH
SPECIMEN QUALITY:: ADEQUATE

## 2018-12-30 NOTE — Assessment & Plan Note (Signed)
We briefly discussed her use of ativan, uses most night to helps with sleep, anxiety since her husband died 4 months ago Discussed possible use of trazadone, she wants to think about it Would start  25mg  at bedtime, can go up to 50mg 

## 2019-01-10 ENCOUNTER — Other Ambulatory Visit (HOSPITAL_COMMUNITY): Payer: Self-pay | Admitting: Family Medicine

## 2019-01-10 DIAGNOSIS — Z1231 Encounter for screening mammogram for malignant neoplasm of breast: Secondary | ICD-10-CM

## 2019-01-14 ENCOUNTER — Telehealth: Payer: Self-pay | Admitting: Family Medicine

## 2019-01-14 ENCOUNTER — Other Ambulatory Visit: Payer: Self-pay | Admitting: Family Medicine

## 2019-01-14 MED ORDER — TRAZODONE HCL 50 MG PO TABS: 25.0000 mg | ORAL_TABLET | Freq: Every evening | ORAL | 2 refills | Status: DC | PRN

## 2019-01-14 NOTE — Telephone Encounter (Signed)
Send over Trazodone take 25-50mg  at bedtime prn sleep #30 R 2

## 2019-01-14 NOTE — Telephone Encounter (Signed)
Spoke with patient and informed her that Trazodone was sent into pharmacy. Patient verbalized understanding.

## 2019-01-14 NOTE — Telephone Encounter (Signed)
Patient called in stating that she thought about the Trazadone and she would like to try it. Please advise?

## 2019-01-24 ENCOUNTER — Ambulatory Visit (HOSPITAL_COMMUNITY)
Admission: RE | Admit: 2019-01-24 | Discharge: 2019-01-24 | Disposition: A | Discharge status: Home / Self Care | Payer: Medicare HMO | Source: Ambulatory Visit | Attending: Family Medicine | Admitting: Family Medicine

## 2019-01-24 ENCOUNTER — Other Ambulatory Visit: Payer: Self-pay

## 2019-01-24 DIAGNOSIS — Z1231 Encounter for screening mammogram for malignant neoplasm of breast: Secondary | ICD-10-CM | POA: Diagnosis not present

## 2019-02-06 ENCOUNTER — Other Ambulatory Visit: Payer: Self-pay | Admitting: Family Medicine

## 2019-02-06 NOTE — Telephone Encounter (Signed)
Requested Prescriptions   Pending Prescriptions Disp Refills  . traZODone (DESYREL) 50 MG tablet [Pharmacy Med Name: TRAZODONE 50 MG TABLET] 90 tablet 0    Sig: TAKE 1/2 TO 1 TABLET (25-50 MG TOTAL) BY MOUTH AT BEDTIME AS NEEDED FOR SLEEP.    Last OV 12/28/2018  Last written 01/14/2019

## 2019-02-15 ENCOUNTER — Other Ambulatory Visit: Payer: Self-pay

## 2019-02-15 ENCOUNTER — Encounter: Payer: Self-pay | Admitting: Family Medicine

## 2019-02-15 ENCOUNTER — Ambulatory Visit (INDEPENDENT_AMBULATORY_CARE_PROVIDER_SITE_OTHER): Payer: Medicare HMO | Admitting: Family Medicine

## 2019-02-15 VITALS — BP 112/58 | HR 78 | Temp 98.1°F | Resp 14 | Ht 67.0 in | Wt 165.0 lb

## 2019-02-15 DIAGNOSIS — E782 Mixed hyperlipidemia: Secondary | ICD-10-CM | POA: Diagnosis not present

## 2019-02-15 DIAGNOSIS — E038 Other specified hypothyroidism: Secondary | ICD-10-CM

## 2019-02-15 DIAGNOSIS — Z23 Encounter for immunization: Secondary | ICD-10-CM | POA: Diagnosis not present

## 2019-02-15 DIAGNOSIS — K219 Gastro-esophageal reflux disease without esophagitis: Secondary | ICD-10-CM | POA: Diagnosis not present

## 2019-02-15 DIAGNOSIS — Z1211 Encounter for screening for malignant neoplasm of colon: Secondary | ICD-10-CM | POA: Diagnosis not present

## 2019-02-15 DIAGNOSIS — Z0000 Encounter for general adult medical examination without abnormal findings: Secondary | ICD-10-CM | POA: Diagnosis not present

## 2019-02-15 DIAGNOSIS — Z0001 Encounter for general adult medical examination with abnormal findings: Secondary | ICD-10-CM | POA: Diagnosis not present

## 2019-02-15 DIAGNOSIS — F419 Anxiety disorder, unspecified: Secondary | ICD-10-CM

## 2019-02-15 DIAGNOSIS — R69 Illness, unspecified: Secondary | ICD-10-CM | POA: Diagnosis not present

## 2019-02-15 NOTE — Progress Notes (Signed)
Subjective:   Patient presents for Medicare Annual/Subsequent preventive examination.     She did try the trazodone for sleep but it didn't have any effect, using half a xanax this works well.  She does drink wine, but not with meds    Taking vITAMINS , b COMPLEX    Hyperlipidemia on Fish pil    GERD- taking omeprazole daily controls symptoms      Review Past Medical/Family/Social: Per EMR   Risk Factors  Current exercise habits: Walks and stays active Dietary issues discussed: No major concerns  Cardiac risk factors: Hyperlipidemia  Depression Screen  (Note: if answer to either of the following is "Yes", a more complete depression screening is indicated)  Over the past two weeks, have you felt down, depressed or hopeless? No Over the past two weeks, have you felt little interest or pleasure in doing things? No Have you lost interest or pleasure in daily life? No Do you often feel hopeless? No Do you cry easily over simple problems? No   Activities of Daily Living  In your present state of health, do you have any difficulty performing the following activities?:  Driving? No  Managing money? No  Feeding yourself? No  Getting from bed to chair? No  Climbing a flight of stairs? No  Preparing food and eating?: No  Bathing or showering? No  Getting dressed: No  Getting to the toilet? No  Using the toilet:No  Moving around from place to place: No  In the past year have you fallen or had a near fall?:No  Are you sexually active? No  Do you have more than one partner? No   Hearing Difficulties: No  Do you often ask people to speak up or repeat themselves? No  Do you experience ringing or noises in your ears? No Do you have difficulty understanding soft or whispered voices? No  Do you feel that you have a problem with memory? No Do you often misplace items? No  Do you feel safe at home? Yes  Cognitive Testing  Alert? Yes Normal Appearance?Yes  Oriented to person? Yes  Place? Yes  Time? Yes  Recall of three objects? Yes  Can perform simple calculations? Yes  Displays appropriate judgment?Yes  Can read the correct time from a watch face?Yes   List the Names of Other Physician/Practitioners you currently use:  GI- Dr. Gala Romney      Screening Tests / Date Colonoscopy      Due                Zostavax - UTD  Bone Density- Due  Mammogram  UTD Influenza Vaccine - Due  Tetanus/tdap Due  Pneumonia- UTD   ROs: GEN- denies fatigue, fever, weight loss,weakness, recent illness HEENT- denies eye drainage, change in vision, nasal discharge, CVS- denies chest pain, palpitations RESP- denies SOB, cough, wheeze ABD- denies N/V, change in stools, abd pain GU- denies dysuria, hematuria, dribbling, incontinence MSK- denies joint pain, muscle aches, injury Neuro- denies headache, dizziness, syncope, seizure activity   Physical : GEN- NAD, alert and oriented x3 HEENT- PERRL, EOMI, non injected sclera, pink conjunctiva, MMM, oropharynx clear Neck- Supple, no thryomegaly, no bruit  CVS- RRR, no murmur RESP-CTAB ABD-NABS,soft,NT,ND Psych- normal affect and mood  EXT- No edema Pulses- Radial, DP- 2+   Assessment:    Annual wellness medicare exam   Plan:    During the course of the visit the patient was educated and counseled about appropriate screening and preventive services including:  Fall/audit C/depression negative     Hyppthyroidism- continue levothyroxine    Vitamin D def- continue supplement     Insomnia with anxiety- xanax prn , No wine with Benzo    Hyperlipidemia - on fish oil   GERD continue omeprazole this is controlling her symptoms  Patient to schedule bone density.   Referral to gastroenterology for repeat colonoscopy.   Discussed advanced directives handout given  Hold on TDAP      Diet review for nutrition referral? Yes ____ Not Indicated __x__  Patient Instructions (the written plan) was given to the patient.   Medicare Attestation  I have personally reviewed:  The patient's medical and social history  Their use of alcohol, tobacco or illicit drugs  Their current medications and supplements  The patient's functional ability including ADLs,fall risks, home safety risks, cognitive, and hearing and visual impairment  Diet and physical activities  Evidence for depression or mood disorders  The patient's weight, height, BMI, and visual acuity have been recorded in the chart. I have made referrals, counseling, and provided education to the patient based on review of the above and I have provided the patient with a written personalized care plan for preventive services.

## 2019-02-15 NOTE — Patient Instructions (Addendum)
Schedule Bone Density  We will call with lab results  Referral to GI- for colonoscopy  F/U 6 months

## 2019-02-16 LAB — COMPREHENSIVE METABOLIC PANEL
AG Ratio: 1.6 (calc) (ref 1.0–2.5)
ALT: 14 U/L (ref 6–29)
AST: 15 U/L (ref 10–35)
Albumin: 4.2 g/dL (ref 3.6–5.1)
Alkaline phosphatase (APISO): 67 U/L (ref 37–153)
BUN: 19 mg/dL (ref 7–25)
CO2: 26 mmol/L (ref 20–32)
Calcium: 9.6 mg/dL (ref 8.6–10.4)
Chloride: 103 mmol/L (ref 98–110)
Creat: 0.73 mg/dL (ref 0.50–0.99)
Globulin: 2.7 g/dL (calc) (ref 1.9–3.7)
Glucose, Bld: 84 mg/dL (ref 65–99)
Potassium: 4.4 mmol/L (ref 3.5–5.3)
Sodium: 141 mmol/L (ref 135–146)
Total Bilirubin: 0.9 mg/dL (ref 0.2–1.2)
Total Protein: 6.9 g/dL (ref 6.1–8.1)

## 2019-02-16 LAB — CBC WITH DIFFERENTIAL/PLATELET
Absolute Monocytes: 490 cells/uL (ref 200–950)
Basophils Absolute: 61 cells/uL (ref 0–200)
Basophils Relative: 1.1 %
Eosinophils Absolute: 209 cells/uL (ref 15–500)
Eosinophils Relative: 3.8 %
HCT: 39.5 % (ref 35.0–45.0)
Hemoglobin: 12.9 g/dL (ref 11.7–15.5)
Lymphs Abs: 2008 cells/uL (ref 850–3900)
MCH: 30.7 pg (ref 27.0–33.0)
MCHC: 32.7 g/dL (ref 32.0–36.0)
MCV: 94 fL (ref 80.0–100.0)
MPV: 10.5 fL (ref 7.5–12.5)
Monocytes Relative: 8.9 %
Neutro Abs: 2734 cells/uL (ref 1500–7800)
Neutrophils Relative %: 49.7 %
Platelets: 281 10*3/uL (ref 140–400)
RBC: 4.2 10*6/uL (ref 3.80–5.10)
RDW: 12.1 % (ref 11.0–15.0)
Total Lymphocyte: 36.5 %
WBC: 5.5 10*3/uL (ref 3.8–10.8)

## 2019-02-16 LAB — LIPID PANEL
Cholesterol: 218 mg/dL — ABNORMAL HIGH (ref <200)
HDL: 80 mg/dL (ref >=50)
LDL Cholesterol (Calc): 121 mg/dL (calc) — ABNORMAL HIGH
Non-HDL Cholesterol (Calc): 138 mg/dL (calc) — ABNORMAL HIGH (ref <130)
Total CHOL/HDL Ratio: 2.7 (calc) (ref <5.0)
Triglycerides: 71 mg/dL (ref <150)

## 2019-02-16 LAB — TSH: TSH: 3.17 mIU/L (ref 0.40–4.50)

## 2019-02-19 ENCOUNTER — Encounter: Payer: Self-pay | Admitting: Internal Medicine

## 2019-03-21 ENCOUNTER — Ambulatory Visit: Payer: Medicare HMO | Admitting: Nurse Practitioner

## 2019-04-08 ENCOUNTER — Other Ambulatory Visit: Payer: Self-pay | Admitting: Family Medicine

## 2019-04-23 DIAGNOSIS — R69 Illness, unspecified: Secondary | ICD-10-CM | POA: Diagnosis not present

## 2019-06-10 ENCOUNTER — Telehealth: Payer: Self-pay | Admitting: *Deleted

## 2019-06-10 NOTE — Telephone Encounter (Signed)
Inquired as to if the COVID Vaccine is appropriate for patient.    Advised that it is the belief of [facility] that all patient's would benefit from the vaccine. Advised that there are no interactions/ contraindications noted with vaccination at this time.  Advised that ue to the storage and handling precautions, we will not be administering the COVID shot at this time.   COVID Vaccination Information As of right now, we will not be giving COVID-19 vaccines here in our office. It is too many storage and administrating regulations that our office is not equipped to provide at this time.    You can go online at [email or web address]   That website will give you information on all counties.    If not here are the numbers you can call. [place] or [place] [phone] opt.2 [facility] [phone]   You can also find information on [email or web address] or call the state's COVID-19 information phone number at 211.

## 2019-06-24 ENCOUNTER — Other Ambulatory Visit: Payer: Self-pay

## 2019-06-24 ENCOUNTER — Ambulatory Visit (INDEPENDENT_AMBULATORY_CARE_PROVIDER_SITE_OTHER): Payer: Medicare HMO | Admitting: Nurse Practitioner

## 2019-06-24 VITALS — BP 136/82 | HR 65 | Temp 97.7°F | Resp 18 | Ht 67.0 in | Wt 164.4 lb

## 2019-06-24 DIAGNOSIS — R82998 Other abnormal findings in urine: Secondary | ICD-10-CM | POA: Diagnosis not present

## 2019-06-24 DIAGNOSIS — N3000 Acute cystitis without hematuria: Secondary | ICD-10-CM | POA: Diagnosis not present

## 2019-06-24 DIAGNOSIS — N393 Stress incontinence (female) (male): Secondary | ICD-10-CM | POA: Insufficient documentation

## 2019-06-24 DIAGNOSIS — R399 Unspecified symptoms and signs involving the genitourinary system: Secondary | ICD-10-CM

## 2019-06-24 LAB — URINALYSIS, ROUTINE W REFLEX MICROSCOPIC
Bacteria, UA: NONE SEEN /HPF
Bilirubin Urine: NEGATIVE
Glucose, UA: NEGATIVE
Hgb urine dipstick: NEGATIVE
Hyaline Cast: NONE SEEN /LPF
Ketones, ur: NEGATIVE
Nitrite: NEGATIVE
Protein, ur: NEGATIVE
RBC / HPF: NONE SEEN /HPF (ref 0–2)
Specific Gravity, Urine: 1.02 (ref 1.001–1.03)
pH: 5.5 (ref 5.0–8.0)

## 2019-06-24 LAB — MICROSCOPIC MESSAGE

## 2019-06-24 MED ORDER — OXYBUTYNIN CHLORIDE 5 MG PO TABS: 5.0000 mg | ORAL_TABLET | Freq: Three times a day (TID) | ORAL | 0 refills | Status: AC

## 2019-06-24 MED ORDER — NITROFURANTOIN MONOHYD MACRO 100 MG PO CAPS: 100.0000 mg | ORAL_CAPSULE | Freq: Two times a day (BID) | ORAL | 0 refills | Status: AC
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
## 2019-07-03 ENCOUNTER — Encounter: Payer: Self-pay | Admitting: Family Medicine

## 2019-07-03 ENCOUNTER — Other Ambulatory Visit: Payer: Self-pay

## 2019-07-03 ENCOUNTER — Ambulatory Visit (INDEPENDENT_AMBULATORY_CARE_PROVIDER_SITE_OTHER): Payer: Medicare HMO | Admitting: Family Medicine

## 2019-07-03 VITALS — BP 130/66 | HR 70 | Temp 98.2°F | Resp 14 | Ht 67.0 in | Wt 164.0 lb

## 2019-07-03 DIAGNOSIS — Z1211 Encounter for screening for malignant neoplasm of colon: Secondary | ICD-10-CM

## 2019-07-03 DIAGNOSIS — N951 Menopausal and female climacteric states: Secondary | ICD-10-CM | POA: Diagnosis not present

## 2019-07-03 DIAGNOSIS — N393 Stress incontinence (female) (male): Secondary | ICD-10-CM

## 2019-07-03 DIAGNOSIS — Z13820 Encounter for screening for osteoporosis: Secondary | ICD-10-CM

## 2019-07-03 DIAGNOSIS — R102 Pelvic and perineal pain: Secondary | ICD-10-CM

## 2019-07-03 LAB — URINALYSIS, ROUTINE W REFLEX MICROSCOPIC
Bilirubin Urine: NEGATIVE
Glucose, UA: NEGATIVE
Hgb urine dipstick: NEGATIVE
Ketones, ur: NEGATIVE
Leukocytes,Ua: NEGATIVE
Nitrite: NEGATIVE
Protein, ur: NEGATIVE
Specific Gravity, Urine: 1.02 (ref 1.001–1.03)
pH: 6.5 (ref 5.0–8.0)

## 2019-07-03 NOTE — Patient Instructions (Signed)
Schedule your bone density  Referral to Gynecology for the pain Referral to [facility]  F/U as needed

## 2019-07-03 NOTE — Progress Notes (Signed)
Subjective:    Patient ID: Courtney Tapia, female    DOB: 12/04/1950, 69 y.o.   MRN: [id number]  Patient presents for Abd Pain (x months- lower abd pain- has been treated for cystitis)  Patient here with ongoing pelvic pain.  She was seen about 10 days ago secondary to UTI symptoms.  He was prescribed oxybutynin as well as Macrobid.  She does have underlying stress incontinence which is not new. She Been having episodes of urinary urgency and pressure on and off for weeks she had taken over-the-counter medication but that was not helping. She was helping to care for her father who passed in Feb, so was unable to come in for a visit.  After completing antibiotics symptoms did improve some, but the lower abd pressure and pain, into pelvic region has not let up. At times also has pain in lower back.  She also feels like there is something hanging low in the vaginal region that she would like to get checked.  She has never had any abnormal Pap smear was not require one at her age. No dysuria symptoms, no changes in bowels, no vaginal discharge, no vaginal bleeding    She also needs to be rescheduled for colonoscopy at a different practice that is closer to her.   She needs orders for her bone density sent in  Review Of Systems:  GEN- denies fatigue, fever, weight loss,weakness, recent illness HEENT- denies eye drainage, change in vision, nasal discharge, CVS- denies chest pain, palpitations RESP- denies SOB, cough, wheeze ABD- denies N/V, change in stools,+ abd pain GU- denies dysuria, hematuria, dribbling, incontinence MSK- denies joint pain, muscle aches, injury Neuro- denies headache, dizziness, syncope, seizure activity       Objective:    BP 130/66   Pulse 70   Temp 98.2 F (36.8 C) (Temporal)   Resp 14   Ht 5\' 7"  (1.702 m)   Wt 164 lb (74.4 kg)   SpO2 98%   BMI 25.69 kg/m  GEN- NAD, alert and oriented x3 HEENT- PERRL, EOMI, non injected sclera, Tapia conjunctiva, CVS-  RRR, no murmur RESP-CTAB ABD-NABS,soft, mild TTP suprapubic region, no CVA tenderness  EXT- No edema Pulses- Radial2+        Assessment & Plan:      Problem List Items Addressed This Visit      Unprioritized   Stress incontinence in female    Is underlying known stress incontinence.  Acquired she may have prolapsed bladder and that is the sensation and discomfort she is feeling.  Urinalysis today was normal but I am sending this for culture.  We will get her set up with gynecology for complete examination.  She does have family history of bladder cancer in her father who recently passed from this. For now to help with her overall symptoms we will go back to oxybutynin 5 mg in the evening this did help with her symptoms.       Other Visit Diagnoses    Suprapubic pain    -  Primary   Relevant Orders   Urinalysis, Routine w reflex microscopic (Completed)   Urine Culture   Ambulatory referral to Gynecology   Pelvic pain       Relevant Orders   Ambulatory referral to Gynecology   Post menopausal syndrome       Relevant Orders   DG Bone Density   Colon cancer screening       Relevant Orders   Ambulatory referral to Gastroenterology  Osteoporosis screening       Relevant Orders   DG Bone Density      Note: This dictation was prepared with Dragon dictation along with smaller phrase technology. Any transcriptional errors that result from this process are unintentional.

## 2019-07-03 NOTE — Assessment & Plan Note (Signed)
Is underlying known stress incontinence.  Acquired she may have prolapsed bladder and that is the sensation and discomfort she is feeling.  Urinalysis today was normal but I am sending this for culture.  We will get her set up with gynecology for complete examination.  She does have family history of bladder cancer in her father who recently passed from this. For now to help with her overall symptoms we will go back to oxybutynin 5 mg in the evening this did help with her symptoms.

## 2019-07-04 LAB — URINE CULTURE
MICRO NUMBER:: 10261621
Result:: NO GROWTH
SPECIMEN QUALITY:: ADEQUATE

## 2019-07-05 ENCOUNTER — Telehealth: Payer: Self-pay | Admitting: *Deleted

## 2019-07-05 NOTE — Telephone Encounter (Signed)
Received VM from patient on 07/04/2019.  Reports that she has questions about the GYN referral.   Please contact her at [phone]~ telephone.

## 2019-07-05 NOTE — Telephone Encounter (Signed)
Spoke with patient and she stated that she just wanted to confirm whom we were referring her to. I informed her that we are sending her to [facility]. Patient verbalized understanding

## 2019-07-08 ENCOUNTER — Telehealth: Payer: Self-pay | Admitting: Gastroenterology

## 2019-07-08 NOTE — Telephone Encounter (Signed)
Dr. Fuller Plan We received a referral for a colon screening  for this  Pt, she  has requested to be scheduled with you.    Her  records have been received and I will send them for your review. Will you accept this pt. Please Advise.

## 2019-07-08 NOTE — Telephone Encounter (Signed)
Records from 04/2010 procedures and pathology reviewed and OK to schedule with me.  Recommend: Colonoscopy for history of adenomatous colon polyps  EGD for history of Barrett's esophagus

## 2019-07-09 ENCOUNTER — Encounter: Payer: Self-pay | Admitting: Gastroenterology

## 2019-07-12 ENCOUNTER — Other Ambulatory Visit: Payer: Self-pay

## 2019-07-15 ENCOUNTER — Encounter: Payer: Self-pay | Admitting: Obstetrics and Gynecology

## 2019-07-15 ENCOUNTER — Ambulatory Visit: Payer: Medicare HMO | Admitting: Obstetrics and Gynecology

## 2019-07-15 ENCOUNTER — Other Ambulatory Visit: Payer: Self-pay

## 2019-07-15 ENCOUNTER — Other Ambulatory Visit: Payer: Self-pay | Admitting: Family Medicine

## 2019-07-15 VITALS — BP 122/78 | Ht 65.0 in | Wt 163.0 lb

## 2019-07-15 DIAGNOSIS — Z01419 Encounter for gynecological examination (general) (routine) without abnormal findings: Secondary | ICD-10-CM

## 2019-07-15 DIAGNOSIS — N8111 Cystocele, midline: Secondary | ICD-10-CM

## 2019-07-15 DIAGNOSIS — Z9189 Other specified personal risk factors, not elsewhere classified: Secondary | ICD-10-CM

## 2019-07-15 DIAGNOSIS — Z124 Encounter for screening for malignant neoplasm of cervix: Secondary | ICD-10-CM | POA: Diagnosis not present

## 2019-07-15 DIAGNOSIS — R102 Pelvic and perineal pain: Secondary | ICD-10-CM | POA: Diagnosis not present

## 2019-07-15 NOTE — Patient Instructions (Signed)
Pelvic Organ Prolapse Pelvic organ prolapse is the stretching, bulging, or dropping of pelvic organs into an abnormal position. It happens when the muscles and tissues that surround and support pelvic structures become weak or stretched. Pelvic organ prolapse can involve the:  Vagina (vaginal prolapse).  Uterus (uterine prolapse).  Bladder (cystocele).  Rectum (rectocele).  Intestines (enterocele). When organs other than the vagina are involved, they often bulge into the vagina or protrude from the vagina, depending on how severe the prolapse is. What are the causes? This condition may be caused by:  Pregnancy, labor, and childbirth.  Past pelvic surgery.  Decreased production of the hormone estrogen associated with menopause.  Consistently lifting more than 50 lb (23 kg).  Obesity.  Long-term inability to pass stool (chronic constipation).  A cough that lasts a long time (chronic).  Buildup of fluid in the abdomen due to certain diseases and other conditions. What are the signs or symptoms? Symptoms of this condition include:  Passing a little urine (loss of bladder control) when you cough, sneeze, strain, and exercise (stress incontinence). This may be worse immediately after childbirth. It may gradually improve over time.  Feeling pressure in your pelvis or vagina. This pressure may increase when you cough or when you are passing stool.  A bulge that protrudes from the opening of your vagina.  Difficulty passing urine or stool.  Pain in your lower back.  Pain, discomfort, or disinterest in sex.  Repeated bladder infections (urinary tract infections).  Difficulty inserting a tampon. In some people, this condition causes no symptoms. How is this diagnosed? This condition may be diagnosed based on a vaginal and rectal exam. During the exam, you may be asked to cough and strain while you are lying down, sitting, and standing up. Your health care provider will  determine if other tests are required, such as bladder function tests. How is this treated? Treatment for this condition may depend on your symptoms. Treatment may include:  Lifestyle changes, such as changes to your diet.  Emptying your bladder at scheduled times (bladder training therapy). This can help reduce or avoid urinary incontinence.  Estrogen. Estrogen may help mild prolapse by increasing the strength and tone of pelvic floor muscles.  Kegel exercises. These may help mild cases of prolapse by strengthening and tightening the muscles of the pelvic floor.  A soft, flexible device that helps support the vaginal walls and keep pelvic organs in place (pessary). This is inserted into your vagina by your health care provider.  Surgery. This is often the only form of treatment for severe prolapse. Follow these instructions at home:  Avoid drinking beverages that contain caffeine or alcohol.  Increase your intake of high-fiber foods. This can help decrease constipation and straining during bowel movements.  Lose weight if recommended by your health care provider.  Wear a sanitary pad or adult diapers if you have urinary incontinence.  Avoid heavy lifting and straining with exercise and work. Do not hold your breath when you perform mild to moderate lifting and exercise activities. Limit your activities as directed by your health care provider.  Do Kegel exercises as directed by your health care provider. To do this: ? Squeeze your pelvic floor muscles tight. You should feel a tight lift in your rectal area and a tightness in your vaginal area. Keep your stomach, buttocks, and legs relaxed. ? Hold the muscles tight for up to 10 seconds. ? Relax your muscles. ? Repeat this exercise 50 times a day,   or as many times as told by your health care provider. Continue to do this exercise for at least 4-6 weeks, or for as long as told by your health care provider.  Take over-the-counter and  prescription medicines only as told by your health care provider.  If you have a pessary, take care of it as told by your health care provider.  Keep all follow-up visits as told by your health care provider. This is important. Contact a health care provider if you:  Have symptoms that interfere with your daily activities or sex life.  Need medicine to help with the discomfort.  Notice bleeding from your vagina that is not related to your period.  Have a fever.  Have pain or bleeding when you urinate.  Have bleeding when you pass stool.  Pass urine when you have sex.  Have chronic constipation.  Have a pessary that falls out.  Have bad smelling vaginal discharge.  Have an unusual, low pain in your abdomen. Summary  Pelvic organ prolapse is the stretching, bulging, or dropping of pelvic organs into an abnormal position. It happens when the muscles and tissues that surround and support pelvic structures become weak or stretched.  When organs other than the vagina are involved, they often bulge into the vagina or protrude from the vagina, depending on how severe the prolapse is.  In most cases, this condition needs to be treated only if it produces symptoms. Treatment may include lifestyle changes, estrogen, Kegel exercises, pessary insertion, or surgery.  Avoid heavy lifting and straining with exercise and work. Do not hold your breath when you perform mild to moderate lifting and exercise activities. Limit your activities as directed by your health care provider. This information is not intended to replace advice given to you by your health care provider. Make sure you discuss any questions you have with your health care provider. Document Revised: 04/26/2017 Document Reviewed: 04/26/2017 Elsevier Patient Education  2020 [employer]  

## 2019-07-15 NOTE — Telephone Encounter (Signed)
Ok to refill??  Last office visit 07/03/2019.  Last refill 12/28/2018, #1 refill.

## 2019-07-15 NOTE — Progress Notes (Signed)
Courtney Tapia [birth year] [id number]  SUBJECTIVE:  69 y.o. G2P2 female new patient here for an annual routine gynecologic exam and Pap smear. She has had intermittent bilateral lower abdominal pressure that is sporadic. No severe pain. Typically feels some pressure discomfort on both sides. No vaginal bleeding. She also indicates that she has felt some increased urinary frequency on several episodes but has had negative urinalyses and no UTIs. She denies any urinary incontinence, trouble with voiding or emptying her bladder. She does feel like "something is coming out" from her vaginal area at times. She has been more active in this last year with lifting and doing yard work since her husband passed recently from esophageal cancer.  Current Outpatient Medications  Medication Sig Dispense Refill  . ALPRAZolam (XANAX) 1 MG tablet TAKE 1 TABLET TWICE A DAY AS NEEDED ANXIETY 60 tablet 1  . b complex vitamins tablet Take 1 tablet by mouth daily.      . Cholecalciferol (VITAMIN D) 2000 units tablet Take 2,000 Units by mouth daily.    . fexofenadine (ALLEGRA) 180 MG tablet Take 180 mg by mouth daily.      Marland Kitchen levothyroxine (SYNTHROID) 25 MCG tablet TAKE 1 TABLET BY MOUTH EVERY DAY BEFORE BREAKFAST 90 tablet 1  . Multiple Vitamin (MULTIVITAMIN) capsule Take 1 capsule by mouth daily.      . Omega-3 Fatty Acids (FISH OIL) 1000 MG CAPS Take by mouth.    Marland Kitchen omeprazole (PRILOSEC) 20 MG capsule TAKE 1 CAPSULE BY MOUTH EVERY DAY 90 capsule 2   No current facility-administered medications for this visit.   Allergies: Simvastatin and Sulfur  No LMP recorded. Patient is postmenopausal.  Past medical history,surgical history, problem list, medications, allergies, family history and social history were all reviewed and documented as reviewed in the EPIC chart.  ROS:  Feeling well. No dyspnea or chest pain on exertion.  No abdominal pain, change in bowel habits, black or bloody stools.  No urinary tract symptoms.  GYN ROS: no abnormal bleeding, pelvic pain or discharge, no breast pain or new or enlarging lumps on self exam. No neurological complaints.   OBJECTIVE:  Ht 5\' 5"  (1.651 m)   Wt 163 lb (73.9 kg)   BMI 27.12 kg/m  The patient appears well, alert, oriented x 3, in no distress. ENT normal.  Neck supple. No cervical or supraclavicular adenopathy or thyromegaly.  Lungs are clear, good air entry, no wheezes, rhonchi or rales. S1 and S2 normal, no murmurs, regular rate and rhythm.  Abdomen soft without tenderness, guarding, mass or organomegaly.  Neurological is normal, no focal findings.  BREAST EXAM: breasts appear normal, no suspicious masses, no skin or nipple changes or axillary nodes  PELVIC EXAM: VULVA: normal appearing vulva with no masses, tenderness or lesions, atrophic changes noted, VAGINA: normal appearing vagina with normal color and discharge, no lesions, grade 2 to seal with Valsalva, well supported cervix and uterus, no rectocele, CERVIX: normal appearing cervix without discharge or lesions, UTERUS: uterus is normal size, shape, consistency and nontender, ADNEXA: normal adnexa in size, nontender and no masses, PAP: Pap smear done today, thin-prep method  Chaperone: Caryn Bee present during the examination  ASSESSMENT:  69 y.o. G2P2 here for annual gynecologic exam  PLAN:   1. Postmenopausal.  No concerns with hot flashes or vaginal bleeding. 2. Pap smear collected today since her last had been several years ago. 3. Mammogram 01/2019.  Normal breast exam today.  She is reminded to schedule an  annual mammogram this year when due. 4. Colonoscopy 2012.  Recommended that she follow up at the recommended interval.   5. DEXA 2011.  Next DEXA has been scheduled by her primary doctor for 09/17/2019. 6.  Cystocele and pelvic pressure.  I suspect her pelvic pressure is due to her cystocele with radiation of symptoms laterally.  We also discussed the possibility of ovarian cysts, but this  is less likely since the pressure is bilateral and tends to alternate sides.  If the pressure sensation becomes more lateralized and more discomfort is noted, I recommended that she let us know and we will consider pelvic ultrasound at that point.  We discussed cystocele and I recommended regular Kegel exercises.  The cystocele seems to be not overly bothersome to her at this time.  She is not having any significant urinary difficulties.  If she does start to develop more significant symptoms, she should let us know.  We briefly mentioned some of the other options for management today to include pelvic floor PT, trial of pessary, and surgical options.  She will monitor her symptoms for now and will let us know if there are any concerns. 7. Health maintenance.  No labs today as she normally has these completed with her primary care provider.    Return annually or sooner, prn.  Joseph Pierini MD, FACOG  07/15/19

## 2019-07-16 LAB — PAP IG W/ RFLX HPV ASCU

## 2019-07-30 ENCOUNTER — Ambulatory Visit (AMBULATORY_SURGERY_CENTER): Payer: Self-pay | Admitting: *Deleted

## 2019-07-30 ENCOUNTER — Other Ambulatory Visit: Payer: Self-pay

## 2019-07-30 VITALS — Temp 97.1°F | Ht 65.0 in | Wt 162.0 lb

## 2019-07-30 DIAGNOSIS — Z86010 Personal history of colonic polyps: Secondary | ICD-10-CM

## 2019-07-30 DIAGNOSIS — K2270 Barrett's esophagus without dysplasia: Secondary | ICD-10-CM

## 2019-07-30 NOTE — Progress Notes (Signed)

## 2019-08-09 ENCOUNTER — Encounter: Payer: Self-pay | Admitting: Gastroenterology

## 2019-08-09 ENCOUNTER — Encounter: Payer: Self-pay | Admitting: Internal Medicine

## 2019-08-13 ENCOUNTER — Other Ambulatory Visit: Payer: Self-pay

## 2019-08-13 ENCOUNTER — Encounter: Payer: Self-pay | Admitting: Gastroenterology

## 2019-08-13 ENCOUNTER — Ambulatory Visit (AMBULATORY_SURGERY_CENTER): Payer: Medicare HMO | Admitting: Gastroenterology

## 2019-08-13 VITALS — BP 140/77 | HR 55 | Temp 97.3°F | Resp 17 | Ht 65.0 in | Wt 162.0 lb

## 2019-08-13 DIAGNOSIS — K2970 Gastritis, unspecified, without bleeding: Secondary | ICD-10-CM

## 2019-08-13 DIAGNOSIS — D122 Benign neoplasm of ascending colon: Secondary | ICD-10-CM | POA: Diagnosis not present

## 2019-08-13 DIAGNOSIS — K3189 Other diseases of stomach and duodenum: Secondary | ICD-10-CM | POA: Diagnosis not present

## 2019-08-13 DIAGNOSIS — K449 Diaphragmatic hernia without obstruction or gangrene: Secondary | ICD-10-CM

## 2019-08-13 DIAGNOSIS — K2100 Gastro-esophageal reflux disease with esophagitis, without bleeding: Secondary | ICD-10-CM | POA: Diagnosis not present

## 2019-08-13 DIAGNOSIS — K2270 Barrett's esophagus without dysplasia: Secondary | ICD-10-CM | POA: Diagnosis not present

## 2019-08-13 DIAGNOSIS — Z86010 Personal history of colonic polyps: Secondary | ICD-10-CM | POA: Diagnosis not present

## 2019-08-13 DIAGNOSIS — K219 Gastro-esophageal reflux disease without esophagitis: Secondary | ICD-10-CM | POA: Diagnosis not present

## 2019-08-13 MED ORDER — OMEPRAZOLE 40 MG PO CPDR: 40.0000 mg | DELAYED_RELEASE_CAPSULE | Freq: Every day | ORAL | 3 refills | Status: DC

## 2019-08-13 MED ORDER — SODIUM CHLORIDE 0.9 % IV SOLN: 500.0000 mL | Freq: Once | INTRAVENOUS | Status: DC

## 2019-08-13 NOTE — Progress Notes (Signed)
To PACU, VSS. Report to [email or web address] 

## 2019-08-13 NOTE — Progress Notes (Signed)
Pt's states no medical or surgical changes since previsit or office visit. 

## 2019-08-13 NOTE — Op Note (Signed)
[place] Patient Name: Courtney Tapia Procedure Date: 08/13/2019 10:32 AM MRN: [id number] Endoscopist: Ladene Artist , MD Age: 69 Referring MD:  Date of Birth: 1950/06/09 Gender: Female Account #: [id number] Procedure:                Colonoscopy Indications:              Surveillance: Personal history of adenomatous                            polyps on last colonoscopy > 5 years ago Medicines:                Monitored Anesthesia Care Procedure:                Pre-Anesthesia Assessment:                           - Prior to the procedure, a History and Physical                            was performed, and patient medications and                            allergies were reviewed. The patient's tolerance of                            previous anesthesia was also reviewed. The risks                            and benefits of the procedure and the sedation                            options and risks were discussed with the patient.                            All questions were answered, and informed consent                            was obtained. Prior Anticoagulants: The patient has                            taken no previous anticoagulant or antiplatelet                            agents. ASA Grade Assessment: II - A patient with                            mild systemic disease. After reviewing the risks                            and benefits, the patient was deemed in                            satisfactory condition to undergo the procedure.  After obtaining informed consent, the colonoscope                            was passed under direct vision. Throughout the                            procedure, the patient's blood pressure, pulse, and                            oxygen saturations were monitored continuously. The                            Colonoscope was introduced through the anus and                            advanced to the the  cecum, identified by                            appendiceal orifice and ileocecal valve. The                            ileocecal valve, appendiceal orifice, and rectum                            were photographed. The quality of the bowel                            preparation was good. The colonoscopy was performed                            without difficulty. The patient tolerated the                            procedure well. Scope In: 10:41:47 AM Scope Out: 11:00:12 AM Scope Withdrawal Time: 0 hours 13 minutes 39 seconds  Total Procedure Duration: 0 hours 18 minutes 25 seconds  Findings:                 The perianal and digital rectal examinations were                            normal.                           A 7 mm polyp was found in the ascending colon. The                            polyp was sessile. The polyp was removed with a                            cold snare. Resection and retrieval were complete.                           Multiple medium-mouthed diverticula were found in  the left colon. There was narrowing of the colon in                            association with the diverticular opening. There                            was evidence of diverticular spasm. There was no                            evidence of diverticular bleeding.                           Internal hemorrhoids were found during                            retroflexion. The hemorrhoids were medium-sized and                            Grade I (internal hemorrhoids that do not prolapse).                           The exam was otherwise without abnormality on                            direct and retroflexion views. Complications:            No immediate complications. Estimated blood loss:                            None. Estimated Blood Loss:     Estimated blood loss: none. Impression:               - One 7 mm polyp in the ascending colon, removed                             with a cold snare. Resected and retrieved.                           - Moderate diverticulosis in the left colon.                           - Internal hemorrhoids.                           - The examination was otherwise normal on direct                            and retroflexion views. Recommendation:           - Repeat colonoscopy after studies are complete for                            surveillance based on pathology results.                           - Patient has a contact number available for  emergencies. The signs and symptoms of potential                            delayed complications were discussed with the                            patient. Return to normal activities tomorrow.                            Written discharge instructions were provided to the                            patient.                           - Continue present medications.                           - Await pathology results.                           - High fiber diet indefinitely. Ladene Artist, MD 08/13/2019 11:14:29 AM This report has been signed electronically.

## 2019-08-13 NOTE — Op Note (Signed)
[place] Patient Name: Courtney Tapia Procedure Date: 08/13/2019 10:33 AM MRN: [id number] Endoscopist: Ladene Artist , MD Age: 69 Referring MD:  Date of Birth: [birth year] Gender: Female Account #: [id number] Procedure:                Upper GI endoscopy Indications:              Surveillance for malignancy due to personal history                            of Barrett's esophagus Medicines:                Monitored Anesthesia Care Procedure:                Pre-Anesthesia Assessment:                           - Prior to the procedure, a History and Physical                            was performed, and patient medications and                            allergies were reviewed. The patient's tolerance of                            previous anesthesia was also reviewed. The risks                            and benefits of the procedure and the sedation                            options and risks were discussed with the patient.                            All questions were answered, and informed consent                            was obtained. Prior Anticoagulants: The patient has                            taken no previous anticoagulant or antiplatelet                            agents. ASA Grade Assessment: II - A patient with                            mild systemic disease. After reviewing the risks                            and benefits, the patient was deemed in                            satisfactory condition to undergo the procedure.                           -  Prior to the procedure, a History and Physical                            was performed, and patient medications and                            allergies were reviewed. The patient's tolerance of                            previous anesthesia was also reviewed. The risks                            and benefits of the procedure and the sedation                            options and risks were discussed with  the patient.                            All questions were answered, and informed consent                            was obtained. Prior Anticoagulants: The patient has                            taken no previous anticoagulant or antiplatelet                            agents. ASA Grade Assessment: II - A patient with                            mild systemic disease. After reviewing the risks                            and benefits, the patient was deemed in                            satisfactory condition to undergo the procedure.                           After obtaining informed consent, the endoscope was                            passed under direct vision. Throughout the                            procedure, the patient's blood pressure, pulse, and                            oxygen saturations were monitored continuously. The                            Endoscope was introduced through the mouth, and  advanced to the second part of duodenum. The upper                            GI endoscopy was accomplished without difficulty.                            The patient tolerated the procedure well. Scope In: Scope Out: Findings:                 There were esophageal mucosal changes secondary to                            established long-segment Barrett's disease present                            in the distal esophagus. The maximum longitudinal                            extent of these mucosal changes was 4 cm in length                            (30-34 cm). Mucosa was biopsied with a cold forceps                            for histology in a targeted manner at intervals of                            1 cm in the lower third of the esophagus. One                            specimen bottle was sent to pathology.                           LA Grade B (one or more mucosal breaks greater than                            5 mm, not extending between the tops of two  mucosal                            folds) esophagitis with no bleeding was found in                            the distal esophagus.                           The exam of the esophagus was otherwise normal.                           Diffuse mildly erythematous mucosa without bleeding                            was found in the gastric body and in the gastric  antrum. Biopsies were taken with a cold forceps for                            histology.                           A medium-sized hiatal hernia was present.                           The exam of the stomach was otherwise normal.                           The duodenal bulb and second portion of the                            duodenum were normal. Complications:            No immediate complications. Estimated Blood Loss:     Estimated blood loss was minimal. Impression:               - Esophageal mucosal changes secondary to                            established long-segment Barrett's disease.                            Biopsied.                           - LA Grade B reflux esophagitis with no bleeding.                           - Erythematous mucosa in the gastric body and                            antrum. Biopsied.                           - Medium-sized hiatal hernia.                           - Normal duodenal bulb and second portion of the                            duodenum. Recommendation:           - Patient has a contact number available for                            emergencies. The signs and symptoms of potential                            delayed complications were discussed with the                            patient. Return to normal activities tomorrow.  Written discharge instructions were provided to the                            patient.                           - Resume previous diet.                           - Antireflux measures long term.                            - Continue present medications.                           - Increase omeprazole to 40 mg po qam, 1 year of                            refills                           - Await pathology results.                           - Repeat upper endoscopy in 2 years for                            surveillance of Barrett's esophagus pending                            pathology review. Ladene Artist, MD 08/13/2019 11:19:20 AM This report has been signed electronically.

## 2019-08-13 NOTE — Patient Instructions (Signed)
YOU HAD AN ENDOSCOPIC PROCEDURE TODAY AT THE [facility]:   Refer to the procedure report that was given to you for any specific questions about what was found during the examination.  If the procedure report does not answer your questions, please call your gastroenterologist to clarify.  If you requested that your care partner not be given the details of your procedure findings, then the procedure report has been included in a sealed envelope for you to review at your convenience later.  YOU SHOULD EXPECT: Some feelings of bloating in the abdomen. Passage of more gas than usual.  Walking can help get rid of the air that was put into your GI tract during the procedure and reduce the bloating. If you had a lower endoscopy (such as a colonoscopy or flexible sigmoidoscopy) you may notice spotting of blood in your stool or on the toilet paper. If you underwent a bowel prep for your procedure, you may not have a normal bowel movement for a few days.  Please Note:  You might notice some irritation and congestion in your nose or some drainage.  This is from the oxygen used during your procedure.  There is no need for concern and it should clear up in a day or so.  SYMPTOMS TO REPORT IMMEDIATELY:   Following lower endoscopy (colonoscopy or flexible sigmoidoscopy):  Excessive amounts of blood in the stool  Significant tenderness or worsening of abdominal pains  Swelling of the abdomen that is new, acute  Fever of 100F or higher   Following upper endoscopy (EGD)  Vomiting of blood or coffee ground material  New chest pain or pain under the shoulder blades  Painful or persistently difficult swallowing  New shortness of breath  Fever of 100F or higher  Black, tarry-looking stools  For urgent or emergent issues, a gastroenterologist can be reached at any hour by calling [phone]. Do not use MyChart messaging for urgent concerns.    DIET:  We do recommend a small meal at first, but  then you may proceed to your regular diet.  Drink plenty of fluids but you should avoid alcoholic beverages for 24 hours.  ACTIVITY:  You should plan to take it easy for the rest of today and you should NOT DRIVE or use heavy machinery until tomorrow (because of the sedation medicines used during the test).    FOLLOW UP: Our staff will call the number listed on your records 48-72 hours following your procedure to check on you and address any questions or concerns that you may have regarding the information given to you following your procedure. If we do not reach you, we will leave a message.  We will attempt to reach you two times.  During this call, we will ask if you have developed any symptoms of COVID 19. If you develop any symptoms (ie: fever, flu-like symptoms, shortness of breath, cough etc.) before then, please call [phone].  If you test positive for Covid 19 in the 2 weeks post procedure, please call and report this information to us.    If any biopsies were taken you will be contacted by phone or by letter within the next 1-3 weeks.  Please call us at [phone] if you have not heard about the biopsies in 3 weeks.    SIGNATURES/CONFIDENTIALITY: You and/or your care partner have signed paperwork which will be entered into your electronic medical record.  These signatures attest to the fact that that the information above on   your After Visit Summary has been reviewed and is understood.  Full responsibility of the confidentiality of this discharge information lies with you and/or your care-partner. 

## 2019-08-15 ENCOUNTER — Telehealth: Payer: Self-pay | Admitting: *Deleted

## 2019-08-15 ENCOUNTER — Telehealth: Payer: Self-pay

## 2019-08-15 NOTE — Telephone Encounter (Signed)
  Follow up Call-  Call back number 08/13/2019  Post procedure Call Back phone  # [phone]  Permission to leave phone message Yes  Some recent data might be hidden     No answer with busy signal.  Unable to leave a message.

## 2019-08-15 NOTE — Telephone Encounter (Signed)
  Follow up Call-  Call back number 08/13/2019  Post procedure Call Back phone  # [phone]  Permission to leave phone message Yes  Some recent data might be hidden     Left message

## 2019-08-22 ENCOUNTER — Other Ambulatory Visit: Payer: Medicare HMO

## 2019-08-26 ENCOUNTER — Encounter: Payer: Self-pay | Admitting: Gastroenterology

## 2019-09-17 ENCOUNTER — Other Ambulatory Visit: Payer: Medicare HMO

## 2019-09-24 ENCOUNTER — Other Ambulatory Visit: Payer: Self-pay

## 2019-09-24 ENCOUNTER — Ambulatory Visit
Admission: RE | Admit: 2019-09-24 | Discharge: 2019-09-24 | Disposition: A | Discharge status: Home / Self Care | Payer: Medicare HMO | Source: Ambulatory Visit | Attending: Family Medicine | Admitting: Family Medicine

## 2019-09-24 ENCOUNTER — Encounter: Payer: Self-pay | Admitting: Family Medicine

## 2019-09-24 DIAGNOSIS — Z13820 Encounter for screening for osteoporosis: Secondary | ICD-10-CM

## 2019-09-24 DIAGNOSIS — Z780 Asymptomatic menopausal state: Secondary | ICD-10-CM | POA: Diagnosis not present

## 2019-09-24 DIAGNOSIS — M8589 Other specified disorders of bone density and structure, multiple sites: Secondary | ICD-10-CM | POA: Diagnosis not present

## 2019-09-24 DIAGNOSIS — N951 Menopausal and female climacteric states: Secondary | ICD-10-CM

## 2019-09-24 DIAGNOSIS — M8580 Other specified disorders of bone density and structure, unspecified site: Secondary | ICD-10-CM | POA: Insufficient documentation

## 2019-09-25 ENCOUNTER — Encounter: Payer: Self-pay | Admitting: *Deleted

## 2019-10-18 ENCOUNTER — Other Ambulatory Visit: Payer: Medicare HMO

## 2019-10-29 DIAGNOSIS — R69 Illness, unspecified: Secondary | ICD-10-CM | POA: Diagnosis not present

## 2019-11-06 ENCOUNTER — Other Ambulatory Visit: Payer: Self-pay | Admitting: Family Medicine

## 2019-11-06 DIAGNOSIS — K2970 Gastritis, unspecified, without bleeding: Secondary | ICD-10-CM

## 2019-11-06 DIAGNOSIS — K2270 Barrett's esophagus without dysplasia: Secondary | ICD-10-CM

## 2019-11-06 MED ORDER — OMEPRAZOLE 40 MG PO CPDR: 40.0000 mg | DELAYED_RELEASE_CAPSULE | Freq: Every day | ORAL | 3 refills | Status: DC

## 2019-11-25 ENCOUNTER — Other Ambulatory Visit: Payer: Medicare HMO

## 2020-01-21 ENCOUNTER — Other Ambulatory Visit (HOSPITAL_COMMUNITY): Payer: Self-pay | Admitting: Family Medicine

## 2020-01-21 DIAGNOSIS — Z1231 Encounter for screening mammogram for malignant neoplasm of breast: Secondary | ICD-10-CM

## 2020-01-28 DIAGNOSIS — R69 Illness, unspecified: Secondary | ICD-10-CM | POA: Diagnosis not present

## 2020-02-28 ENCOUNTER — Ambulatory Visit (HOSPITAL_COMMUNITY): Payer: Medicare HMO

## 2020-03-03 ENCOUNTER — Other Ambulatory Visit: Payer: Self-pay | Admitting: Family Medicine

## 2020-03-06 ENCOUNTER — Ambulatory Visit (HOSPITAL_COMMUNITY): Payer: Medicare HMO

## 2020-03-10 ENCOUNTER — Other Ambulatory Visit: Payer: Self-pay | Admitting: Family Medicine

## 2020-03-10 NOTE — Telephone Encounter (Signed)
Ok to refill??  Last office visit 07/03/2019.  Last refill 07/15/2019, #1 refill.

## 2020-04-02 ENCOUNTER — Ambulatory Visit (HOSPITAL_COMMUNITY)
Admission: RE | Admit: 2020-04-02 | Discharge: 2020-04-02 | Disposition: A | Discharge status: Home / Self Care | Payer: Medicare HMO | Source: Ambulatory Visit | Attending: Family Medicine | Admitting: Family Medicine

## 2020-04-02 ENCOUNTER — Other Ambulatory Visit: Payer: Self-pay

## 2020-04-02 DIAGNOSIS — Z1231 Encounter for screening mammogram for malignant neoplasm of breast: Secondary | ICD-10-CM | POA: Insufficient documentation

## 2020-04-06 ENCOUNTER — Ambulatory Visit (HOSPITAL_COMMUNITY): Payer: Medicare HMO

## 2020-04-17 ENCOUNTER — Other Ambulatory Visit: Payer: Self-pay | Admitting: Family Medicine

## 2020-05-07 ENCOUNTER — Telehealth: Payer: Self-pay | Admitting: *Deleted

## 2020-05-07 ENCOUNTER — Other Ambulatory Visit: Payer: Self-pay

## 2020-05-07 ENCOUNTER — Other Ambulatory Visit: Payer: Medicare HMO

## 2020-05-07 DIAGNOSIS — E038 Other specified hypothyroidism: Secondary | ICD-10-CM

## 2020-05-07 NOTE — Telephone Encounter (Signed)
Received call from patient.   Reports that she has been called for [place] for the week on 06/15/2020. States that she is having increased anxiety and is asking for exemption.   Please advise.

## 2020-05-07 NOTE — Telephone Encounter (Signed)
She needs to send [place] a copy of the jury note I will write the exemption letter

## 2020-05-08 LAB — TSH: TSH: 4.98 mIU/L — ABNORMAL HIGH (ref 0.40–4.50)

## 2020-05-08 NOTE — Telephone Encounter (Signed)
Call placed to patient and patient made aware.   Appointment scheduled.  

## 2020-05-12 ENCOUNTER — Ambulatory Visit (HOSPITAL_COMMUNITY)
Admission: RE | Admit: 2020-05-12 | Discharge: 2020-05-12 | Disposition: A | Discharge status: Home / Self Care | Payer: Medicare HMO | Source: Ambulatory Visit | Attending: Family Medicine | Admitting: Family Medicine

## 2020-05-12 ENCOUNTER — Encounter: Payer: Self-pay | Admitting: Family Medicine

## 2020-05-12 ENCOUNTER — Other Ambulatory Visit: Payer: Self-pay

## 2020-05-12 ENCOUNTER — Ambulatory Visit (INDEPENDENT_AMBULATORY_CARE_PROVIDER_SITE_OTHER): Payer: Medicare HMO | Admitting: Family Medicine

## 2020-05-12 VITALS — BP 128/62 | HR 64 | Temp 98.4°F | Resp 14 | Ht 65.0 in | Wt 166.0 lb

## 2020-05-12 DIAGNOSIS — M25471 Effusion, right ankle: Secondary | ICD-10-CM | POA: Insufficient documentation

## 2020-05-12 DIAGNOSIS — R69 Illness, unspecified: Secondary | ICD-10-CM | POA: Diagnosis not present

## 2020-05-12 DIAGNOSIS — D229 Melanocytic nevi, unspecified: Secondary | ICD-10-CM | POA: Diagnosis not present

## 2020-05-12 DIAGNOSIS — F419 Anxiety disorder, unspecified: Secondary | ICD-10-CM

## 2020-05-12 DIAGNOSIS — E038 Other specified hypothyroidism: Secondary | ICD-10-CM | POA: Diagnosis not present

## 2020-05-12 DIAGNOSIS — K2270 Barrett's esophagus without dysplasia: Secondary | ICD-10-CM | POA: Diagnosis not present

## 2020-05-12 DIAGNOSIS — E782 Mixed hyperlipidemia: Secondary | ICD-10-CM | POA: Diagnosis not present

## 2020-05-12 DIAGNOSIS — K219 Gastro-esophageal reflux disease without esophagitis: Secondary | ICD-10-CM | POA: Diagnosis not present

## 2020-05-12 DIAGNOSIS — M7989 Other specified soft tissue disorders: Secondary | ICD-10-CM | POA: Diagnosis not present

## 2020-05-12 NOTE — Assessment & Plan Note (Signed)
Continue prn xanax and and sleep

## 2020-05-12 NOTE — Assessment & Plan Note (Signed)
She is on omeprazole 40mg  F/u EGD in 2years

## 2020-05-12 NOTE — Progress Notes (Signed)
Subjective:    Patient ID: [place] Pink, female    DOB: 24-Jun-1950, 70 y.o.   MRN: [id number]  Patient presents for Follow-up (Is fasting/)  Patient here to follow-up medications.  She is currently taking.  Hypothyroidism her TSH came back mildly elevated at 4.98 she is currently on levothyroxine 25 mcg once a day.  no current hypothyroid symptoms   Chronic insomnia/ anxiety she is currently  alprazolam as needed, she stopped trazodone due to headaches, she was very anxious last night just knowing she had to get here for an appointment  Acid reflux she takes omeprazole 40 mg as needed, she had EGD in April 2021, had segment Barretts and gastritis   Patient also requested letter for jury duty she would like to be excused in setting of her high anxiety   Hyperlipidemia taking omega 3 due for fasting labs  She has a spot on left chest wall that wont heal it is red and scaley and peels off  Right ankle pain with swelling since November , she did have injury as a child that was severe she does not recall if she sprained or rolled, but still has swelling and occ pain when she walks  Review Of Systems:  GEN- denies fatigue, fever, weight loss,weakness, recent illness HEENT- denies eye drainage, change in vision, nasal discharge, CVS- denies chest pain, palpitations RESP- denies SOB, cough, wheeze ABD- denies N/V, change in stools, abd pain GU- denies dysuria, hematuria, dribbling, incontinence MSK- +joint pain, muscle aches, injury Neuro- denies headache, dizziness, syncope, seizure activity       Objective:    BP 128/62   Pulse 64   Temp 98.4 F (36.9 C) (Temporal)   Resp 14   Ht 5\' 5"  (1.651 m)   Wt 166 lb (75.3 kg)   SpO2 97%   BMI 27.62 kg/m  GEN- NAD, alert and oriented x3 HEENT- PERRL, EOMI, non injected sclera, pink conjunctiva, MMM, oropharynx clear Neck- Supple, no thyromegaly CVS- RRR, no murmur RESP-CTAB ABD-NABS,soft,NT,ND PSYCH- normal affect and mood   Skin- left chest wall - erythematous scaley lesion  , sun spots MSK- Right ankle swelling over bilat malleous, mild TTP, fair ROM foot/ankle, no limp, fair ROM knees  EXT- No edema Pulses- Radial, DP- 2+        Assessment & Plan:      Problem List Items Addressed This Visit      Unprioritized   Anxiety - Primary    Continue prn xanax and and sleep       Barrett esophagus    She is on omeprazole 40mg  F/u EGD in 2years       Relevant Orders   CBC with Differential/Platelet   Comprehensive metabolic panel   GERD   Relevant Orders   CBC with Differential/Platelet   Comprehensive metabolic panel   Hyperlipidemia    Check lipids       Relevant Orders   Lipid panel   Hypothyroidism    No current symptoms, check T3/T4 and adjust if needed       Relevant Orders   T3, free   T4, free    Other Visit Diagnoses    Right ankle swelling       obtain xray of ankle, most likely has some DJD   Relevant Orders   DG Ankle Complete Right      Note: This dictation was prepared with Dragon dictation along with smaller phrase technology. Any transcriptional errors that result from this  process are unintentional.

## 2020-05-12 NOTE — Assessment & Plan Note (Signed)
No current symptoms, check T3/T4 and adjust if needed

## 2020-05-12 NOTE — Patient Instructions (Addendum)
Referral to dermatology - Courtney Tapia  We will call with lab results  Xray right ankle/foot - [facility]

## 2020-05-12 NOTE — Assessment & Plan Note (Signed)
Check lipids 

## 2020-05-13 LAB — T3, FREE: T3, Free: 3.1 pg/mL (ref 2.3–4.2)

## 2020-05-13 LAB — CBC WITH DIFFERENTIAL/PLATELET
Absolute Monocytes: 518 cells/uL (ref 200–950)
Basophils Absolute: 43 cells/uL (ref 0–200)
Basophils Relative: 0.6 %
Eosinophils Absolute: 170 cells/uL (ref 15–500)
Eosinophils Relative: 2.4 %
HCT: 42 % (ref 35.0–45.0)
Hemoglobin: 13.9 g/dL (ref 11.7–15.5)
Lymphs Abs: 2052 cells/uL (ref 850–3900)
MCH: 32.3 pg (ref 27.0–33.0)
MCHC: 33.1 g/dL (ref 32.0–36.0)
MCV: 97.4 fL (ref 80.0–100.0)
MPV: 10.4 fL (ref 7.5–12.5)
Monocytes Relative: 7.3 %
Neutro Abs: 4317 cells/uL (ref 1500–7800)
Neutrophils Relative %: 60.8 %
Platelets: 293 10*3/uL (ref 140–400)
RBC: 4.31 10*6/uL (ref 3.80–5.10)
RDW: 11.8 % (ref 11.0–15.0)
Total Lymphocyte: 28.9 %
WBC: 7.1 10*3/uL (ref 3.8–10.8)

## 2020-05-13 LAB — LIPID PANEL
Cholesterol: 211 mg/dL — ABNORMAL HIGH (ref <200)
HDL: 89 mg/dL (ref >=50)
LDL Cholesterol (Calc): 106 mg/dL (calc) — ABNORMAL HIGH
Non-HDL Cholesterol (Calc): 122 mg/dL (calc) (ref <130)
Total CHOL/HDL Ratio: 2.4 (calc) (ref <5.0)
Triglycerides: 70 mg/dL (ref <150)

## 2020-05-13 LAB — COMPREHENSIVE METABOLIC PANEL
AG Ratio: 1.5 (calc) (ref 1.0–2.5)
ALT: 14 U/L (ref 6–29)
AST: 18 U/L (ref 10–35)
Albumin: 4.2 g/dL (ref 3.6–5.1)
Alkaline phosphatase (APISO): 69 U/L (ref 37–153)
BUN: 20 mg/dL (ref 7–25)
CO2: 26 mmol/L (ref 20–32)
Calcium: 9.4 mg/dL (ref 8.6–10.4)
Chloride: 104 mmol/L (ref 98–110)
Creat: 0.7 mg/dL (ref 0.50–0.99)
Globulin: 2.8 g/dL (calc) (ref 1.9–3.7)
Glucose, Bld: 90 mg/dL (ref 65–99)
Potassium: 4.6 mmol/L (ref 3.5–5.3)
Sodium: 142 mmol/L (ref 135–146)
Total Bilirubin: 0.8 mg/dL (ref 0.2–1.2)
Total Protein: 7 g/dL (ref 6.1–8.1)

## 2020-05-13 LAB — T4, FREE: Free T4: 1.3 ng/dL (ref 0.8–1.8)

## 2020-06-01 ENCOUNTER — Other Ambulatory Visit: Payer: Self-pay | Admitting: Family Medicine

## 2020-06-08 ENCOUNTER — Other Ambulatory Visit: Payer: Self-pay | Admitting: *Deleted

## 2020-06-08 DIAGNOSIS — M19071 Primary osteoarthritis, right ankle and foot: Secondary | ICD-10-CM

## 2020-06-08 DIAGNOSIS — G8929 Other chronic pain: Secondary | ICD-10-CM

## 2020-06-08 DIAGNOSIS — M7731 Calcaneal spur, right foot: Secondary | ICD-10-CM

## 2020-06-16 ENCOUNTER — Ambulatory Visit: Payer: Medicare HMO | Admitting: Physician Assistant

## 2020-06-17 ENCOUNTER — Telehealth: Payer: Self-pay | Admitting: Gastroenterology

## 2020-06-17 NOTE — Telephone Encounter (Signed)
Patient scheduled for Previsit 08/19/20 at 9:00am  Patient scheduled for EGD- [facility] Dr. Fuller Plan 09/02/20 at 10:00am  Thank you

## 2020-06-17 NOTE — Telephone Encounter (Signed)
Yes patient is due .  Letter was mailed to patient and is correct.  She needed a 6 month recall for EGD.  Please help her schedule.

## 2020-06-25 ENCOUNTER — Other Ambulatory Visit: Payer: Self-pay | Admitting: Family Medicine

## 2020-08-26 ENCOUNTER — Other Ambulatory Visit: Payer: Self-pay | Admitting: Family Medicine

## 2020-08-26 ENCOUNTER — Telehealth: Payer: Self-pay | Admitting: *Deleted

## 2020-08-26 ENCOUNTER — Other Ambulatory Visit: Payer: Self-pay

## 2020-08-26 ENCOUNTER — Ambulatory Visit (AMBULATORY_SURGERY_CENTER): Payer: Medicare HMO | Admitting: *Deleted

## 2020-08-26 VITALS — Ht 65.0 in | Wt 165.0 lb

## 2020-08-26 DIAGNOSIS — K22719 Barrett's esophagus with dysplasia, unspecified: Secondary | ICD-10-CM

## 2020-08-26 NOTE — Telephone Encounter (Signed)
Unable to reach patient at either phone number on file for virtual pre visit.  Message left  Requesting patient to return call and reschedule pv, EGD 09/09/2020 .

## 2020-08-26 NOTE — Progress Notes (Addendum)
No egg or soy allergy known to patient  No issues with past sedation with any surgeries or procedures Patient denies ever being told they had issues or difficulty with intubation  No FH of Malignant Hyperthermia No diet pills per patient No home 02 use per patient  No blood thinners per patient  Pt denies issues with constipation  No A fib or A flutter  EMMI video to pt or via [place] 19 guidelines implemented in [place] today with Pt and RN  Pt is fully vaccinated  for Covid   Virtual pre visit completed. Instructions sent through mychart.  Due to the COVID-19 pandemic we are asking patients to follow certain guidelines.  Pt aware of COVID protocols and [facility] guidelines

## 2020-08-28 ENCOUNTER — Ambulatory Visit: Payer: Medicare HMO | Admitting: Internal Medicine

## 2020-09-01 ENCOUNTER — Encounter: Payer: Self-pay | Admitting: Internal Medicine

## 2020-09-01 ENCOUNTER — Ambulatory Visit (INDEPENDENT_AMBULATORY_CARE_PROVIDER_SITE_OTHER): Payer: Medicare HMO | Admitting: Internal Medicine

## 2020-09-01 ENCOUNTER — Other Ambulatory Visit: Payer: Self-pay

## 2020-09-01 VITALS — BP 150/82 | HR 62 | Temp 98.4°F | Resp 18 | Ht 65.0 in | Wt 169.4 lb

## 2020-09-01 DIAGNOSIS — G4700 Insomnia, unspecified: Secondary | ICD-10-CM | POA: Diagnosis not present

## 2020-09-01 DIAGNOSIS — Z7689 Persons encountering health services in other specified circumstances: Secondary | ICD-10-CM

## 2020-09-01 DIAGNOSIS — E038 Other specified hypothyroidism: Secondary | ICD-10-CM

## 2020-09-01 DIAGNOSIS — F419 Anxiety disorder, unspecified: Secondary | ICD-10-CM | POA: Diagnosis not present

## 2020-09-01 DIAGNOSIS — R030 Elevated blood-pressure reading, without diagnosis of hypertension: Secondary | ICD-10-CM

## 2020-09-01 DIAGNOSIS — M8580 Other specified disorders of bone density and structure, unspecified site: Secondary | ICD-10-CM | POA: Diagnosis not present

## 2020-09-01 DIAGNOSIS — E782 Mixed hyperlipidemia: Secondary | ICD-10-CM | POA: Diagnosis not present

## 2020-09-01 DIAGNOSIS — R69 Illness, unspecified: Secondary | ICD-10-CM | POA: Diagnosis not present

## 2020-09-01 DIAGNOSIS — I10 Essential (primary) hypertension: Secondary | ICD-10-CM | POA: Insufficient documentation

## 2020-09-01 DIAGNOSIS — K2270 Barrett's esophagus without dysplasia: Secondary | ICD-10-CM | POA: Diagnosis not present

## 2020-09-01 DIAGNOSIS — R21 Rash and other nonspecific skin eruption: Secondary | ICD-10-CM | POA: Diagnosis not present

## 2020-09-01 NOTE — Assessment & Plan Note (Signed)
BP Readings from Last 1 Encounters:  09/01/20 (!) 150/82   Could be due to first visit related nervousness and situational anxiety Recheck after 6 weeks Advised DASH diet and moderate exercise/walking, at least 150 mins/week

## 2020-09-01 NOTE — Patient Instructions (Addendum)
Please continue taking medications as prescribed.  You are being referred to Dermatology for evaluation of skin rash.  Please get fasting blood tests done before the next visit.  Please continue to follow low salt diet and ambulate as tolerated.

## 2020-09-01 NOTE — Assessment & Plan Note (Signed)
On Levothyroxine 25 mcg QD Check TSH and free T4

## 2020-09-01 NOTE — Assessment & Plan Note (Signed)
Takes Xanax 0.5 - 1 mg QD PRN PDMP reviewed 

## 2020-09-01 NOTE — Assessment & Plan Note (Signed)
Care established Previous chart reviewed History and medications reviewed with the patient 

## 2020-09-01 NOTE — Assessment & Plan Note (Signed)
Follow up with Dr Fuller Plan Planned to have repeat EGD on 05/25 On Omeprazole

## 2020-09-01 NOTE — Assessment & Plan Note (Signed)
Reviewed last lipid profile Continue to follow low cholesterol diet 

## 2020-09-01 NOTE — Assessment & Plan Note (Signed)
Trazodone 50 mg qHS PRN
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
## 2020-09-01 NOTE — Assessment & Plan Note (Signed)
Multiple macules noted Referred to Dermatology for possible need for skin biopsy

## 2020-09-01 NOTE — Assessment & Plan Note (Signed)
On calcium and Vitamin D supplements

## 2020-09-02 ENCOUNTER — Encounter: Payer: Medicare HMO | Admitting: Gastroenterology

## 2020-09-04 ENCOUNTER — Encounter: Payer: Self-pay | Admitting: Gastroenterology

## 2020-09-09 ENCOUNTER — Encounter: Payer: Self-pay | Admitting: Gastroenterology

## 2020-09-09 ENCOUNTER — Ambulatory Visit (AMBULATORY_SURGERY_CENTER): Payer: Medicare HMO | Admitting: Gastroenterology

## 2020-09-09 ENCOUNTER — Other Ambulatory Visit: Payer: Self-pay

## 2020-09-09 VITALS — BP 134/84 | HR 68 | Temp 98.4°F | Resp 16 | Ht 65.0 in | Wt 165.0 lb

## 2020-09-09 DIAGNOSIS — K22719 Barrett's esophagus with dysplasia, unspecified: Secondary | ICD-10-CM | POA: Diagnosis not present

## 2020-09-09 DIAGNOSIS — K2270 Barrett's esophagus without dysplasia: Secondary | ICD-10-CM | POA: Diagnosis not present

## 2020-09-09 DIAGNOSIS — K219 Gastro-esophageal reflux disease without esophagitis: Secondary | ICD-10-CM | POA: Diagnosis not present

## 2020-09-09 DIAGNOSIS — K449 Diaphragmatic hernia without obstruction or gangrene: Secondary | ICD-10-CM

## 2020-09-09 MED ORDER — SODIUM CHLORIDE 0.9 % IV SOLN: 500.0000 mL | Freq: Once | INTRAVENOUS | Status: DC

## 2020-09-09 NOTE — Op Note (Signed)
[facility] Patient Name: Courtney Tapia Procedure Date: 09/09/2020 10:00 AM MRN: [id number] Endoscopist: Ladene Artist , MD Age: 70 Referring MD:  Date of Birth: [birth year] Gender: Female Account #: [id number] Procedure:                Upper GI endoscopy Indications:              Surveillance for malignancy due to personal history                            of Barrett's esophagus Medicines:                Monitored Anesthesia Care Procedure:                Pre-Anesthesia Assessment:                           - Prior to the procedure, a History and Physical                            was performed, and patient medications and                            allergies were reviewed. The patient's tolerance of                            previous anesthesia was also reviewed. The risks                            and benefits of the procedure and the sedation                            options and risks were discussed with the patient.                            All questions were answered, and informed consent                            was obtained. Prior Anticoagulants: The patient has                            taken no previous anticoagulant or antiplatelet                            agents. ASA Grade Assessment: II - A patient with                            mild systemic disease. After reviewing the risks                            and benefits, the patient was deemed in                            satisfactory condition to undergo the procedure.  After obtaining informed consent, the endoscope was                            passed under direct vision. Throughout the                            procedure, the patient's blood pressure, pulse, and                            oxygen saturations were monitored continuously. The                            Endoscope was introduced through the mouth, and                            advanced to the second part of  duodenum. The upper                            GI endoscopy was accomplished without difficulty.                            The patient tolerated the procedure well. Scope In: Scope Out: Findings:                 There were esophageal mucosal changes secondary to                            established long-segment Barrett's disease present                            in the distal esophagus. The maximum longitudinal                            extent of these mucosal changes was 4 cm in length.                            Mucosa was biopsied with a cold forceps for                            histology in a targeted manner at intervals of 1 cm                            in the lower third of the esophagus. One specimen                            bottle was sent to pathology.                           The exam of the esophagus was otherwise normal.                           A medium-sized hiatal hernia was present.  Diffuse mildly erythematous mucosa without bleeding                            was found in the entire examined stomach.                            Previously biopsied.                           The exam of the stomach was otherwise normal.                           The duodenal bulb and second portion of the                            duodenum were normal. Complications:            No immediate complications. Estimated Blood Loss:     Estimated blood loss was minimal. Impression:               - Esophageal mucosal changes secondary to                            established long-segment Barrett's disease.                            Biopsied.                           - Medium-sized hiatal hernia.                           - Erythematous mucosa in the stomach.                           - Normal duodenal bulb and second portion of the                            duodenum. Recommendation:           - Patient has a contact number available for                             emergencies. The signs and symptoms of potential                            delayed complications were discussed with the                            patient. Return to normal activities tomorrow.                            Written discharge instructions were provided to the                            patient.                           -  Resume previous diet.                           - Follow antireflux measures.                           - Continue present medications.                           - Await pathology results.                           - Repeat upper endoscopy for surveillance, timing                            based on pathology results. Ladene Artist, MD 09/09/2020 10:20:04 AM This report has been signed electronically.

## 2020-09-09 NOTE — Progress Notes (Signed)
Pt. Reports no change in her medical or surgical history since her pre-visit 08/26/2020.

## 2020-09-09 NOTE — Patient Instructions (Signed)
Thank you for allowing [place] to care for you today!  Await biopsy results. Approximately 7-10 days.  WIll make further recommendations at that time.  Follow antireflux measures, handout given.  Resume previous diet and medications today.  Return to your normal activities tomorrow.  YOU HAD AN ENDOSCOPIC PROCEDURE TODAY AT [facility]:   Refer to the procedure report that was given to you for any specific questions about what was found during the examination.  If the procedure report does not answer your questions, please call your gastroenterologist to clarify.  If you requested that your care partner not be given the details of your procedure findings, then the procedure report has been included in a sealed envelope for you to review at your convenience later.  YOU SHOULD EXPECT: Some feelings of bloating in the abdomen. Passage of more gas than usual.  Walking can help get rid of the air that was put into your GI tract during the procedure and reduce the bloating. If you had a lower endoscopy (such as a colonoscopy or flexible sigmoidoscopy) you may notice spotting of blood in your stool or on the toilet paper. If you underwent a bowel prep for your procedure, you may not have a normal bowel movement for a few days.  Please Note:  You might notice some irritation and congestion in your nose or some drainage.  This is from the oxygen used during your procedure.  There is no need for concern and it should clear up in a day or so.  SYMPTOMS TO REPORT IMMEDIATELY:     Following upper endoscopy (EGD)  Vomiting of blood or coffee ground material  New chest pain or pain under the shoulder blades  Painful or persistently difficult swallowing  New shortness of breath  Fever of 100F or higher  Black, tarry-looking stools  For urgent or emergent issues, a gastroenterologist can be reached at any hour by calling [phone]. Do not use MyChart messaging for urgent concerns.     DIET:  We do recommend a small meal at first, but then you may proceed to your regular diet.  Drink plenty of fluids but you should avoid alcoholic beverages for 24 hours.  ACTIVITY:  You should plan to take it easy for the rest of today and you should NOT DRIVE or use heavy machinery until tomorrow (because of the sedation medicines used during the test).    FOLLOW UP: Our staff will call the number listed on your records 48-72 hours following your procedure to check on you and address any questions or concerns that you may have regarding the information given to you following your procedure. If we do not reach you, we will leave a message.  We will attempt to reach you two times.  During this call, we will ask if you have developed any symptoms of COVID 19. If you develop any symptoms (ie: fever, flu-like symptoms, shortness of breath, cough etc.) before then, please call [phone].  If you test positive for Covid 19 in the 2 weeks post procedure, please call and report this information to [place].    If any biopsies were taken you will be contacted by phone or by letter within the next 1-3 weeks.  Please call us at [phone] if you have not heard about the biopsies in 3 weeks.    SIGNATURES/CONFIDENTIALITY: You and/or your care partner have signed paperwork which will be entered into your electronic medical record.  These signatures attest to  the fact that that the information above on your After Visit Summary has been reviewed and is understood.  Full responsibility of the confidentiality of this discharge information lies with you and/or your care-partner.

## 2020-09-09 NOTE — Progress Notes (Signed)
To PACU, vss. Report to [email or web address] 

## 2020-09-09 NOTE — Progress Notes (Signed)
Called to room to assist during endoscopic procedure.  Patient ID and intended procedure confirmed with present staff. Received instructions for my participation in the procedure from the performing physician.  

## 2020-09-11 ENCOUNTER — Telehealth: Payer: Self-pay

## 2020-09-11 NOTE — Telephone Encounter (Signed)
  Follow up Call-  Call back number 09/09/2020 08/13/2019  Post procedure Call Back phone  # [phone]  Permission to leave phone message Yes Yes  Some recent data might be hidden     Patient questions:  Do you have a fever, pain , or abdominal swelling? No. Pain Score  0 *  Have you tolerated food without any problems? Yes.    Have you been able to return to your normal activities? Yes.    Do you have any questions about your discharge instructions: Diet   No. Medications  No. Follow up visit  No.  Do you have questions or concerns about your Care? No.  Actions: * If pain score is 4 or above: No action needed, pain <4.  1. Have you developed a fever since your procedure? no  2.   Have you had an respiratory symptoms (SOB or cough) since your procedure? no  3.   Have you tested positive for COVID 19 since your procedure no  4.   Have you had any family members/close contacts diagnosed with the COVID 19 since your procedure?  no   If yes to any of these questions please route to Joylene John, RN and Joella Prince, RN

## 2020-09-12 ENCOUNTER — Other Ambulatory Visit: Payer: Self-pay | Admitting: Family Medicine

## 2020-09-14 ENCOUNTER — Other Ambulatory Visit: Payer: Self-pay | Admitting: Family Medicine

## 2020-09-24 ENCOUNTER — Encounter: Payer: Self-pay | Admitting: Gastroenterology

## 2020-09-24 ENCOUNTER — Other Ambulatory Visit: Payer: Self-pay | Admitting: Nurse Practitioner

## 2020-09-24 ENCOUNTER — Telehealth: Payer: Self-pay

## 2020-09-24 MED ORDER — ALPRAZOLAM 1 MG PO TABS: 0.5000 mg | ORAL_TABLET | Freq: Every day | ORAL | 1 refills | Status: DC | PRN

## 2020-09-24 NOTE — Telephone Encounter (Signed)
Sent in refill

## 2020-09-24 NOTE — Telephone Encounter (Signed)
Pt is calling to get a refill on Xanax

## 2020-09-25 ENCOUNTER — Telehealth: Payer: Self-pay

## 2020-09-25 NOTE — Telephone Encounter (Signed)
Patient called said [facility] does not have Dr Posey Pronto on record at the location [facility] [place] and need to call this prescribed in or fax in to the pharmacy.  ALPRAZolam Duanne Moron) 1 MG tablet   [id number] [place] [place] - [address] [place]  [address], [place] [address]

## 2020-09-25 NOTE — Telephone Encounter (Signed)
Patient informed prescription was faxed into pharmacy and to wait 30 minutes check with pharmacy.

## 2020-09-25 NOTE — Telephone Encounter (Signed)
This was just faxed to pharmacy should be received by them let her know to check with pharmacy in about 30 mins to see if they have received

## 2020-10-13 ENCOUNTER — Ambulatory Visit: Payer: Medicare HMO | Admitting: Internal Medicine

## 2020-10-28 ENCOUNTER — Other Ambulatory Visit: Payer: Self-pay

## 2020-10-28 ENCOUNTER — Ambulatory Visit (INDEPENDENT_AMBULATORY_CARE_PROVIDER_SITE_OTHER): Payer: Medicare HMO | Admitting: *Deleted

## 2020-10-28 DIAGNOSIS — Z0000 Encounter for general adult medical examination without abnormal findings: Secondary | ICD-10-CM

## 2020-10-28 NOTE — Patient Instructions (Signed)
Courtney Tapia , Thank you for taking time to come for your Medicare Wellness Visit. I appreciate your ongoing commitment to your health goals. Please review the following plan we discussed and let me know if I can assist you in the future.   Screening recommendations/referrals: Colonoscopy: Completed Due 08-12-24 Mammogram: Completed 04-02-21 Bone Density: Completed 09-23-24 Recommended yearly ophthalmology/optometry visit for glaucoma screening and checkup Recommended yearly dental visit for hygiene and checkup  Vaccinations: Influenza vaccine: 11-16-20 Pneumococcal vaccine: Completed Tdap vaccine: Due Now  Shingles vaccine: Due Now    Advanced directives: Patient advised to bring copy of living will to office for copy to be made   Next appointment: 1 year    Preventive Care 68 Years and Older, Female Preventive care refers to lifestyle choices and visits with your health care provider that can promote health and wellness. What does preventive care include? A yearly physical exam. This is also called an annual well check. Dental exams once or twice a year. Routine eye exams. Ask your health care provider how often you should have your eyes checked. Personal lifestyle choices, including: Daily care of your teeth and gums. Regular physical activity. Eating a healthy diet. Avoiding tobacco and drug use. Limiting alcohol use. Practicing safe sex. Taking low-dose aspirin every day. Taking vitamin and mineral supplements as recommended by your health care provider. What happens during an annual well check? The services and screenings done by your health care provider during your annual well check will depend on your age, overall health, lifestyle risk factors, and family history of disease. Counseling  Your health care provider may ask you questions about your: Alcohol use. Tobacco use. Drug use. Emotional well-being. Home and relationship well-being. Sexual activity. Eating  habits. History of falls. Memory and ability to understand (cognition). Work and work Statistician. Reproductive health. Screening  You may have the following tests or measurements: Height, weight, and BMI. Blood pressure. Lipid and cholesterol levels. These may be checked every 5 years, or more frequently if you are over 16 years old. Skin check. Lung cancer screening. You may have this screening every year starting at age 68 if you have a 30-pack-year history of smoking and currently smoke or have quit within the past 15 years. Fecal occult blood test (FOBT) of the stool. You may have this test every year starting at age 68. Flexible sigmoidoscopy or colonoscopy. You may have a sigmoidoscopy every 5 years or a colonoscopy every 10 years starting at age 68. Hepatitis C blood test. Hepatitis B blood test. Sexually transmitted disease (STD) testing. Diabetes screening. This is done by checking your blood sugar (glucose) after you have not eaten for a while (fasting). You may have this done every 1-3 years. Bone density scan. This is done to screen for osteoporosis. You may have this done starting at age 68. Mammogram. This may be done every 1-2 years. Talk to your health care provider about how often you should have regular mammograms. Talk with your health care provider about your test results, treatment options, and if necessary, the need for more tests. Vaccines  Your health care provider may recommend certain vaccines, such as: Influenza vaccine. This is recommended every year. Tetanus, diphtheria, and acellular pertussis (Tdap, Td) vaccine. You may need a Td booster every 10 years. Zoster vaccine. You may need this after age 50. Pneumococcal 13-valent conjugate (PCV13) vaccine. One dose is recommended after age 68. Pneumococcal polysaccharide (PPSV23) vaccine. One dose is recommended after age 110. Talk to your  health care provider about which screenings and vaccines you need and how  often you need them. This information is not intended to replace advice given to you by your health care provider. Make sure you discuss any questions you have with your health care provider. Document Released: 05/01/2015 Document Revised: 12/23/2015 Document Reviewed: 02/03/2015 Elsevier Interactive Patient Education  2017 [place] Prevention in the Home Falls can cause injuries. They can happen to people of all ages. There are many things you can do to make your home safe and to help prevent falls. What can I do on the outside of my home? Regularly fix the edges of walkways and driveways and fix any cracks. Remove anything that might make you trip as you walk through a door, such as a raised step or threshold. Trim any bushes or trees on the path to your home. Use bright outdoor lighting. Clear any walking paths of anything that might make someone trip, such as rocks or tools. Regularly check to see if handrails are loose or broken. Make sure that both sides of any steps have handrails. Any raised decks and porches should have guardrails on the edges. Have any leaves, snow, or ice cleared regularly. Use sand or salt on walking paths during winter. Clean up any spills in your garage right away. This includes oil or grease spills. What can I do in the bathroom? Use night lights. Install grab bars by the toilet and in the tub and shower. Do not use towel bars as grab bars. Use non-skid mats or decals in the tub or shower. If you need to sit down in the shower, use a plastic, non-slip stool. Keep the floor dry. Clean up any water that spills on the floor as soon as it happens. Remove soap buildup in the tub or shower regularly. Attach bath mats securely with double-sided non-slip rug tape. Do not have throw rugs and other things on the floor that can make you trip. What can I do in the bedroom? Use night lights. Make sure that you have a light by your bed that is easy to  reach. Do not use any sheets or blankets that are too big for your bed. They should not hang down onto the floor. Have a firm chair that has side arms. You can use this for support while you get dressed. Do not have throw rugs and other things on the floor that can make you trip. What can I do in the kitchen? Clean up any spills right away. Avoid walking on wet floors. Keep items that you use a lot in easy-to-reach places. If you need to reach something above you, use a strong step stool that has a grab bar. Keep electrical cords out of the way. Do not use floor polish or wax that makes floors slippery. If you must use wax, use non-skid floor wax. Do not have throw rugs and other things on the floor that can make you trip. What can I do with my stairs? Do not leave any items on the stairs. Make sure that there are handrails on both sides of the stairs and use them. Fix handrails that are broken or loose. Make sure that handrails are as long as the stairways. Check any carpeting to make sure that it is firmly attached to the stairs. Fix any carpet that is loose or worn. Avoid having throw rugs at the top or bottom of the stairs. If you do have throw rugs, attach them  to the floor with carpet tape. Make sure that you have a light switch at the top of the stairs and the bottom of the stairs. If you do not have them, ask someone to add them for you. What else can I do to help prevent falls? Wear shoes that: Do not have high heels. Have rubber bottoms. Are comfortable and fit you well. Are closed at the toe. Do not wear sandals. If you use a stepladder: Make sure that it is fully opened. Do not climb a closed stepladder. Make sure that both sides of the stepladder are locked into place. Ask someone to hold it for you, if possible. Clearly mark and make sure that you can see: Any grab bars or handrails. First and last steps. Where the edge of each step is. Use tools that help you move  around (mobility aids) if they are needed. These include: Canes. Walkers. Scooters. Crutches. Turn on the lights when you go into a dark area. Replace any light bulbs as soon as they burn out. Set up your furniture so you have a clear path. Avoid moving your furniture around. If any of your floors are uneven, fix them. If there are any pets around you, be aware of where they are. Review your medicines with your doctor. Some medicines can make you feel dizzy. This can increase your chance of falling. Ask your doctor what other things that you can do to help prevent falls. This information is not intended to replace advice given to you by your health care provider. Make sure you discuss any questions you have with your health care provider. Document Released: 01/29/2009 Document Revised: 09/10/2015 Document Reviewed: 05/09/2014 Elsevier Interactive Patient Education  2017 [employer].
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
## 2020-11-10 ENCOUNTER — Ambulatory Visit: Payer: Medicare HMO | Admitting: Internal Medicine

## 2020-11-10 DIAGNOSIS — E038 Other specified hypothyroidism: Secondary | ICD-10-CM | POA: Diagnosis not present

## 2020-11-11 LAB — BASIC METABOLIC PANEL
BUN/Creatinine Ratio: 19 (ref 12–28)
BUN: 14 mg/dL (ref 8–27)
CO2: 25 mmol/L (ref 20–29)
Calcium: 9.2 mg/dL (ref 8.7–10.3)
Chloride: 104 mmol/L (ref 96–106)
Creatinine, Ser: 0.75 mg/dL (ref 0.57–1.00)
Glucose: 89 mg/dL (ref 65–99)
Potassium: 4.2 mmol/L (ref 3.5–5.2)
Sodium: 142 mmol/L (ref 134–144)
eGFR: 87 mL/min/{1.73_m2} (ref >59)

## 2020-11-11 LAB — TSH+FREE T4
Free T4: 1.21 ng/dL (ref 0.82–1.77)
TSH: 4.56 u[IU]/mL — ABNORMAL HIGH (ref 0.450–4.500)

## 2020-11-17 ENCOUNTER — Ambulatory Visit (INDEPENDENT_AMBULATORY_CARE_PROVIDER_SITE_OTHER): Payer: Medicare HMO | Admitting: Internal Medicine

## 2020-11-17 ENCOUNTER — Other Ambulatory Visit: Payer: Self-pay

## 2020-11-17 ENCOUNTER — Encounter: Payer: Self-pay | Admitting: Internal Medicine

## 2020-11-17 VITALS — BP 138/70 | HR 86 | Temp 97.5°F | Resp 18 | Ht 65.0 in | Wt 167.4 lb

## 2020-11-17 DIAGNOSIS — R21 Rash and other nonspecific skin eruption: Secondary | ICD-10-CM

## 2020-11-17 DIAGNOSIS — G4700 Insomnia, unspecified: Secondary | ICD-10-CM | POA: Diagnosis not present

## 2020-11-17 DIAGNOSIS — E038 Other specified hypothyroidism: Secondary | ICD-10-CM | POA: Diagnosis not present

## 2020-11-17 DIAGNOSIS — R030 Elevated blood-pressure reading, without diagnosis of hypertension: Secondary | ICD-10-CM | POA: Diagnosis not present

## 2020-11-17 MED ORDER — LEVOTHYROXINE SODIUM 50 MCG PO TABS: 50.0000 ug | ORAL_TABLET | Freq: Every day | ORAL | 2 refills | Status: DC
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
## 2020-11-17 NOTE — Assessment & Plan Note (Signed)
BP Readings from Last 1 Encounters:  11/17/20 138/70   Advised DASH diet and moderate exercise/walking, at least 150 mins/week

## 2020-11-17 NOTE — Assessment & Plan Note (Signed)
Multiple macules noted Referred to Dermatology for possible need for skin biopsy

## 2020-11-17 NOTE — Patient Instructions (Signed)
Please start taking Levothyroxine 50 mcg once daily instead of 25 mcg.  Please continue to follow DASH diet and perform moderate exercise/walking at least 150 mins/week.  Please continue to take other medications as prescribed.  Please get blood tests done after 6 weeks.

## 2020-11-17 NOTE — Assessment & Plan Note (Signed)
Trazodone 50 mg qHS PRN

## 2020-11-17 NOTE — Assessment & Plan Note (Signed)
Lab Results  Component Value Date   TSH 4.560 (H) 11/10/2020   Increased Levothyroxine to 50 mcg QD Recheck TSH and free T4 after 6 weeks Adjust dose accoordingly

## 2020-12-16 ENCOUNTER — Other Ambulatory Visit: Payer: Self-pay | Admitting: *Deleted

## 2020-12-16 DIAGNOSIS — K2270 Barrett's esophagus without dysplasia: Secondary | ICD-10-CM

## 2020-12-16 DIAGNOSIS — K2970 Gastritis, unspecified, without bleeding: Secondary | ICD-10-CM

## 2020-12-16 MED ORDER — OMEPRAZOLE 40 MG PO CPDR
40.0000 mg | DELAYED_RELEASE_CAPSULE | Freq: Every day | ORAL | 3 refills | Status: DC
Start: 2020-12-16 — End: 2021-10-27

## 2020-12-30 DIAGNOSIS — E038 Other specified hypothyroidism: Secondary | ICD-10-CM | POA: Diagnosis not present

## 2020-12-31 ENCOUNTER — Telehealth: Payer: Self-pay

## 2020-12-31 LAB — TSH+FREE T4
Free T4: 1.35 ng/dL (ref 0.82–1.77)
TSH: 3.16 u[IU]/mL (ref 0.450–4.500)

## 2020-12-31 NOTE — Telephone Encounter (Signed)
Returned pt call regarding labs

## 2020-12-31 NOTE — Telephone Encounter (Signed)
Patient returning call about labs.

## 2021-01-03 ENCOUNTER — Other Ambulatory Visit: Payer: Self-pay | Admitting: Internal Medicine

## 2021-01-03 DIAGNOSIS — E038 Other specified hypothyroidism: Secondary | ICD-10-CM

## 2021-01-23 ENCOUNTER — Encounter: Payer: Self-pay | Admitting: Family Medicine

## 2021-02-03 ENCOUNTER — Ambulatory Visit: Payer: Medicare HMO | Admitting: Internal Medicine

## 2021-02-24 ENCOUNTER — Ambulatory Visit: Payer: Medicare HMO | Admitting: Physician Assistant

## 2021-02-24 ENCOUNTER — Other Ambulatory Visit: Payer: Self-pay

## 2021-02-24 DIAGNOSIS — D485 Neoplasm of uncertain behavior of skin: Secondary | ICD-10-CM

## 2021-02-24 DIAGNOSIS — Q825 Congenital non-neoplastic nevus: Secondary | ICD-10-CM | POA: Diagnosis not present

## 2021-02-24 DIAGNOSIS — L570 Actinic keratosis: Secondary | ICD-10-CM

## 2021-02-24 NOTE — Patient Instructions (Signed)

## 2021-03-01 ENCOUNTER — Telehealth: Payer: Self-pay | Admitting: *Deleted

## 2021-03-01 ENCOUNTER — Encounter: Payer: Self-pay | Admitting: Physician Assistant

## 2021-03-01 NOTE — Progress Notes (Signed)
   New Patient   Subjective  Courtney Tapia is a 70 y.o. female who presents for the following: New Patient (Initial Visit) (Patient here today for skin check this is patient's first dermatology appointment. No personal history or family history of atypical moles, melanoma or non mole skin cancer. Per patient she does have one mole on her back x years that she would like checked. ).   The following portions of the chart were reviewed this encounter and updated as appropriate:  Tobacco  Allergies  Meds  Problems  Med Hx  Surg Hx  Fam Hx      Objective  Well appearing patient in no apparent distress; mood and affect are within normal limits.  A full examination was performed including scalp, head, eyes, ears, nose, lips, neck, chest, axillae, abdomen, back, buttocks, bilateral upper extremities, bilateral lower extremities, hands, feet, fingers, toes, fingernails, and toenails. All findings within normal limits unless otherwise noted below.  Dorsum of Nose Pink scale     Left Chest Erythematous patches with gritty scale.  Right Middle Plantar Surface Deep red, purple macule     Assessment & Plan  Neoplasm of uncertain behavior of skin Dorsum of Nose  Skin / nail biopsy Type of biopsy: tangential   Informed consent: discussed and consent obtained   Timeout: patient name, date of birth, surgical site, and procedure verified   Procedure prep:  Patient was prepped and draped in usual sterile fashion (Non sterile) Prep type:  Chlorhexidine Anesthesia: the lesion was anesthetized in a standard fashion   Anesthetic:  1% lidocaine w/ epinephrine 1-100,000 local infiltration Instrument used: flexible razor blade   Outcome: patient tolerated procedure well   Post-procedure details: wound care instructions given    Specimen 1 - Surgical pathology Differential Diagnosis: bcc vs scc  Check Margins: No  Actinic keratosis Left Chest  Destruction of lesion - Left  Chest Complexity: simple   Destruction method: cryotherapy   Informed consent: discussed and consent obtained   Timeout:  patient name, date of birth, surgical site, and procedure verified Lesion destroyed using liquid nitrogen: Yes   Cryotherapy cycles:  3 Outcome: patient tolerated procedure well with no complications    Nevus flammeus Right Middle Plantar Surface  observe    I, Jash Wahlen, PA-C, have reviewed all documentation's for this visit.  The documentation on 03/01/21 for the exam, diagnosis, procedures and orders are all accurate and complete.

## 2021-03-01 NOTE — Telephone Encounter (Signed)
Pathology to patient.  °

## 2021-03-01 NOTE — Telephone Encounter (Signed)
-----   Message from Warren Danes, [place] sent at 02/26/2021  2:48 PM EST ----- RTC if recurs

## 2021-03-01 NOTE — Telephone Encounter (Signed)
Left message for patient to return our phone call for results.

## 2021-03-02 ENCOUNTER — Ambulatory Visit (INDEPENDENT_AMBULATORY_CARE_PROVIDER_SITE_OTHER): Payer: Medicare HMO | Admitting: Internal Medicine

## 2021-03-02 ENCOUNTER — Other Ambulatory Visit: Payer: Self-pay

## 2021-03-02 ENCOUNTER — Encounter: Payer: Self-pay | Admitting: Internal Medicine

## 2021-03-02 VITALS — BP 146/76 | HR 70 | Temp 97.7°F | Resp 18 | Ht 66.0 in | Wt 164.1 lb

## 2021-03-02 DIAGNOSIS — E038 Other specified hypothyroidism: Secondary | ICD-10-CM

## 2021-03-02 DIAGNOSIS — K2270 Barrett's esophagus without dysplasia: Secondary | ICD-10-CM

## 2021-03-02 DIAGNOSIS — R69 Illness, unspecified: Secondary | ICD-10-CM | POA: Diagnosis not present

## 2021-03-02 DIAGNOSIS — L570 Actinic keratosis: Secondary | ICD-10-CM | POA: Diagnosis not present

## 2021-03-02 DIAGNOSIS — I10 Essential (primary) hypertension: Secondary | ICD-10-CM | POA: Diagnosis not present

## 2021-03-02 DIAGNOSIS — F419 Anxiety disorder, unspecified: Secondary | ICD-10-CM

## 2021-03-02 NOTE — Assessment & Plan Note (Signed)
Lab Results  Component Value Date   TSH 3.160 12/30/2020   On Levothyroxine 50 mcg QD now

## 2021-03-02 NOTE — Patient Instructions (Signed)
Please continue taking medications as prescribed.  Please continue to follow low salt diet and perform moderate exercise/walking at least 150 mins/week. 
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
## 2021-03-02 NOTE — Addendum Note (Signed)
Addended byIhor Dow on: 03/02/2021 01:01 PM   Modules accepted: Level of Service

## 2021-03-02 NOTE — Assessment & Plan Note (Signed)
Over nose area and upper chest wall area, has seen Dermatology

## 2021-03-02 NOTE — Assessment & Plan Note (Signed)
BP Readings from Last 1 Encounters:  03/02/21 (!) 146/76   New-onset Came from work directly, has been stressed If persistently elevated, will start ARB Advised to contact with home BP readings Advised DASH diet and moderate exercise/walking, at least 150 mins/week

## 2021-03-02 NOTE — Assessment & Plan Note (Signed)
Takes Xanax 0.5 - 1 mg QD PRN PDMP reviewed 

## 2021-03-02 NOTE — Assessment & Plan Note (Signed)
Follow up with Dr Stark On Omeprazole 

## 2021-03-10 ENCOUNTER — Encounter: Payer: Self-pay | Admitting: Internal Medicine

## 2021-03-16 ENCOUNTER — Other Ambulatory Visit: Payer: Self-pay | Admitting: Internal Medicine

## 2021-03-16 DIAGNOSIS — E038 Other specified hypothyroidism: Secondary | ICD-10-CM

## 2021-04-05 ENCOUNTER — Other Ambulatory Visit: Payer: Self-pay | Admitting: Nurse Practitioner

## 2021-04-05 DIAGNOSIS — F419 Anxiety disorder, unspecified: Secondary | ICD-10-CM

## 2021-04-05 MED ORDER — ALPRAZOLAM 1 MG PO TABS: 0.5000 mg | ORAL_TABLET | Freq: Every day | ORAL | 1 refills | Status: DC | PRN

## 2021-05-03 ENCOUNTER — Other Ambulatory Visit (HOSPITAL_COMMUNITY): Payer: Self-pay | Admitting: Internal Medicine

## 2021-05-03 DIAGNOSIS — Z1231 Encounter for screening mammogram for malignant neoplasm of breast: Secondary | ICD-10-CM

## 2021-05-12 ENCOUNTER — Ambulatory Visit (HOSPITAL_COMMUNITY)
Admission: RE | Admit: 2021-05-12 | Discharge: 2021-05-12 | Disposition: A | Discharge status: Home / Self Care | Payer: Medicare HMO | Source: Ambulatory Visit | Attending: Internal Medicine | Admitting: Internal Medicine

## 2021-05-12 ENCOUNTER — Other Ambulatory Visit: Payer: Self-pay

## 2021-05-12 DIAGNOSIS — Z1231 Encounter for screening mammogram for malignant neoplasm of breast: Secondary | ICD-10-CM | POA: Insufficient documentation

## 2021-06-09 ENCOUNTER — Telehealth: Payer: Self-pay

## 2021-06-09 ENCOUNTER — Other Ambulatory Visit: Payer: Self-pay | Admitting: Internal Medicine

## 2021-06-09 DIAGNOSIS — E038 Other specified hypothyroidism: Secondary | ICD-10-CM

## 2021-06-09 DIAGNOSIS — E782 Mixed hyperlipidemia: Secondary | ICD-10-CM

## 2021-06-09 DIAGNOSIS — R7303 Prediabetes: Secondary | ICD-10-CM

## 2021-06-09 DIAGNOSIS — I10 Essential (primary) hypertension: Secondary | ICD-10-CM

## 2021-06-09 NOTE — Telephone Encounter (Signed)
Pt notified with verbal understanding  °

## 2021-06-09 NOTE — Telephone Encounter (Signed)
Patient has appt next Wednesday, 03.01 with Dr Posey Pronto and no blood work has been ordered, does she need bloodwork?

## 2021-06-10 DIAGNOSIS — E782 Mixed hyperlipidemia: Secondary | ICD-10-CM | POA: Diagnosis not present

## 2021-06-10 DIAGNOSIS — R7303 Prediabetes: Secondary | ICD-10-CM | POA: Diagnosis not present

## 2021-06-10 DIAGNOSIS — E038 Other specified hypothyroidism: Secondary | ICD-10-CM | POA: Diagnosis not present

## 2021-06-10 DIAGNOSIS — I10 Essential (primary) hypertension: Secondary | ICD-10-CM | POA: Diagnosis not present

## 2021-06-11 LAB — CBC
Hematocrit: 39 % (ref 34.0–46.6)
Hemoglobin: 12.8 g/dL (ref 11.1–15.9)
MCH: 31.4 pg (ref 26.6–33.0)
MCHC: 32.8 g/dL (ref 31.5–35.7)
MCV: 96 fL (ref 79–97)
Platelets: 288 10*3/uL (ref 150–450)
RBC: 4.07 x10E6/uL (ref 3.77–5.28)
RDW: 11.8 % (ref 11.7–15.4)
WBC: 7.3 10*3/uL (ref 3.4–10.8)

## 2021-06-11 LAB — TSH+FREE T4
Free T4: 1.44 ng/dL (ref 0.82–1.77)
TSH: 3.58 u[IU]/mL (ref 0.450–4.500)

## 2021-06-11 LAB — BASIC METABOLIC PANEL
BUN/Creatinine Ratio: 24 (ref 12–28)
BUN: 16 mg/dL (ref 8–27)
CO2: 24 mmol/L (ref 20–29)
Calcium: 9.4 mg/dL (ref 8.7–10.3)
Chloride: 104 mmol/L (ref 96–106)
Creatinine, Ser: 0.68 mg/dL (ref 0.57–1.00)
Glucose: 85 mg/dL (ref 70–99)
Potassium: 4.3 mmol/L (ref 3.5–5.2)
Sodium: 142 mmol/L (ref 134–144)
eGFR: 95 mL/min/{1.73_m2} (ref >59)

## 2021-06-11 LAB — HEMOGLOBIN A1C
Est. average glucose Bld gHb Est-mCnc: 108 mg/dL
Hgb A1c MFr Bld: 5.4 % (ref 4.8–5.6)

## 2021-06-11 LAB — LIPID PANEL
Chol/HDL Ratio: 2.2 ratio (ref 0.0–4.4)
Cholesterol, Total: 184 mg/dL (ref 100–199)
HDL: 84 mg/dL (ref >39)
LDL Chol Calc (NIH): 89 mg/dL (ref 0–99)
Triglycerides: 56 mg/dL (ref 0–149)
VLDL Cholesterol Cal: 11 mg/dL (ref 5–40)

## 2021-06-16 ENCOUNTER — Ambulatory Visit (INDEPENDENT_AMBULATORY_CARE_PROVIDER_SITE_OTHER): Payer: Medicare HMO | Admitting: Internal Medicine

## 2021-06-16 ENCOUNTER — Other Ambulatory Visit: Payer: Self-pay

## 2021-06-16 ENCOUNTER — Encounter: Payer: Self-pay | Admitting: Internal Medicine

## 2021-06-16 VITALS — BP 148/78 | HR 67 | Resp 18 | Ht 65.0 in | Wt 163.6 lb

## 2021-06-16 DIAGNOSIS — E038 Other specified hypothyroidism: Secondary | ICD-10-CM

## 2021-06-16 DIAGNOSIS — G4700 Insomnia, unspecified: Secondary | ICD-10-CM | POA: Diagnosis not present

## 2021-06-16 DIAGNOSIS — K2270 Barrett's esophagus without dysplasia: Secondary | ICD-10-CM | POA: Diagnosis not present

## 2021-06-16 DIAGNOSIS — I10 Essential (primary) hypertension: Secondary | ICD-10-CM | POA: Diagnosis not present

## 2021-06-16 MED ORDER — ESZOPICLONE 1 MG PO TABS: 1.0000 mg | ORAL_TABLET | Freq: Every evening | ORAL | 2 refills | Status: DC | PRN

## 2021-06-16 MED ORDER — LOSARTAN POTASSIUM 25 MG PO TABS: 25.0000 mg | ORAL_TABLET | Freq: Every day | ORAL | 3 refills | Status: DC

## 2021-06-16 NOTE — Patient Instructions (Addendum)
Please start taking Losartan as prescribed. ? ?Please continue taking other medications as prescribed. ? ?Please check BP at home and contact if BP is more than 140/90 on 3 consecutive readings. ? ?Please follow low salt diet and perform moderate exercise/walking at least 150 mins/week. ? ?Please start taking Lunesta 1 mg daily. ? ?Please get blood test done after 2 weeks. ?

## 2021-06-18 NOTE — Assessment & Plan Note (Signed)
Follow up with Dr Stark On Omeprazole 
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
## 2021-06-18 NOTE — Assessment & Plan Note (Addendum)
BP Readings from Last 1 Encounters:  ?06/16/21 (!) 148/78  ? ?New onset ?Started Losartan 25 mg QD, check BMP after 2 weeks ?Counseled for compliance with the medications ?Advised DASH diet and moderate exercise/walking, at least 150 mins/week ?

## 2021-06-18 NOTE — Assessment & Plan Note (Signed)
Did not improve with Trazodone, started Lunesta 1 mg qHS PRN ?Takes Xanax 0.5 - 1 mg QD PRN for anxiety ?PDMP reviewed ?

## 2021-06-18 NOTE — Assessment & Plan Note (Signed)
Lab Results  Component Value Date   TSH 3.580 06/10/2021   On Levothyroxine 50 mcg QD now 

## 2021-06-19 ENCOUNTER — Other Ambulatory Visit: Payer: Self-pay | Admitting: Internal Medicine

## 2021-06-19 DIAGNOSIS — E038 Other specified hypothyroidism: Secondary | ICD-10-CM

## 2021-06-24 ENCOUNTER — Ambulatory Visit: Payer: Medicare HMO | Admitting: Internal Medicine

## 2021-06-30 ENCOUNTER — Ambulatory Visit: Payer: Medicare HMO | Admitting: Internal Medicine

## 2021-07-01 DIAGNOSIS — I10 Essential (primary) hypertension: Secondary | ICD-10-CM | POA: Diagnosis not present

## 2021-07-02 ENCOUNTER — Encounter: Payer: Self-pay | Admitting: Nurse Practitioner

## 2021-07-02 ENCOUNTER — Ambulatory Visit (INDEPENDENT_AMBULATORY_CARE_PROVIDER_SITE_OTHER): Payer: Medicare HMO | Admitting: Nurse Practitioner

## 2021-07-02 ENCOUNTER — Other Ambulatory Visit: Payer: Self-pay

## 2021-07-02 ENCOUNTER — Encounter: Payer: Self-pay | Admitting: Internal Medicine

## 2021-07-02 DIAGNOSIS — R8290 Unspecified abnormal findings in urine: Secondary | ICD-10-CM | POA: Diagnosis not present

## 2021-07-02 DIAGNOSIS — N390 Urinary tract infection, site not specified: Secondary | ICD-10-CM | POA: Diagnosis not present

## 2021-07-02 LAB — POCT URINALYSIS DIP (CLINITEK)
Bilirubin, UA: NEGATIVE
Blood, UA: NEGATIVE
Glucose, UA: NEGATIVE mg/dL
Ketones, POC UA: NEGATIVE mg/dL
Nitrite, UA: POSITIVE — AB
POC PROTEIN,UA: NEGATIVE
Spec Grav, UA: 1.01 (ref 1.010–1.025)
Urobilinogen, UA: 0.2 E.U./dL
pH, UA: 6 (ref 5.0–8.0)

## 2021-07-02 LAB — BASIC METABOLIC PANEL
BUN/Creatinine Ratio: 29 — ABNORMAL HIGH (ref 12–28)
BUN: 22 mg/dL (ref 8–27)
CO2: 24 mmol/L (ref 20–29)
Calcium: 9.3 mg/dL (ref 8.7–10.3)
Chloride: 100 mmol/L (ref 96–106)
Creatinine, Ser: 0.75 mg/dL (ref 0.57–1.00)
Glucose: 70 mg/dL (ref 70–99)
Potassium: 4.5 mmol/L (ref 3.5–5.2)
Sodium: 139 mmol/L (ref 134–144)
eGFR: 87 mL/min/{1.73_m2} (ref >59)

## 2021-07-02 MED ORDER — AMOXICILLIN-POT CLAVULANATE 875-125 MG PO TABS: 1.0000 | ORAL_TABLET | Freq: Two times a day (BID) | ORAL | 0 refills | Status: AC

## 2021-07-02 NOTE — Assessment & Plan Note (Signed)
UA shows positive nitrites, large leukocytes negative for blood. ?Start Augmentin 875-125 mg, take 1 tablet twice daily for 7 days. ?Drink at least 64 ounces of water daily. ?Urine sent to the lab for culture and sensitivity.  ? ?

## 2021-07-02 NOTE — Progress Notes (Signed)
Virtual Visit via Telephone Note ? ?I connected with Courtney Tapia on 07/02/21 at 1:03pm  by telephone and verified that I am speaking with the correct person using two identifiers.  I spent 7 minutes on this telephone encounter. ? ?Location: ?Patient: home ?Provider: office ?  ?I discussed the limitations, risks, security and privacy concerns of performing an evaluation and management service by telephone and the availability of in person appointments. I also discussed with the patient that there may be a patient responsible charge related to this service. The patient expressed understanding and agreed to proceed. ? ? ?History of Present Illness: ?Patient with history of essential hypertension, hypothyroidism, anxiety, hyperlipidemia complains of urinary urgency, pressure in the pelvic area, foul urine odor ongoing for months. Pt denies fever, chills, dysuria, confusion.   ? ?Observations/Objective: ? ? ?Assessment and Plan: ?Urinary tract infection. ?UA shows positive nitrites, large leukocytes negative for blood. ?Start Augmentin 875-125 mg, take 1 tablet twice daily for 7 days. ?Drink at least 64 ounces of water daily. ?Urine sent to the lab for culture and sensitivity.  ? ?Follow Up Instructions: ? ?  ?I discussed the assessment and treatment plan with the patient. The patient was provided an opportunity to ask questions and all were answered. The patient agreed with the plan and demonstrated an understanding of the instructions. ?  ?The patient was advised to call back or seek an in-person evaluation if the symptoms worsen or if the condition fails to improve as anticipated.  ?

## 2021-07-07 ENCOUNTER — Telehealth: Payer: Self-pay

## 2021-07-07 LAB — URINE CULTURE

## 2021-07-07 NOTE — Telephone Encounter (Signed)
Patient aware of lab results.

## 2021-07-07 NOTE — Telephone Encounter (Signed)
Patient returning lab result call 

## 2021-07-07 NOTE — Telephone Encounter (Signed)
Can you give patient a call to go over her lab results. ?

## 2021-07-07 NOTE — Progress Notes (Signed)
Urine culture positive for E. Coli. ?Patient should complete dose of Augmentin antibiotics that was ordered for her. ?Thank you

## 2021-07-13 ENCOUNTER — Other Ambulatory Visit: Payer: Self-pay | Admitting: Internal Medicine

## 2021-07-13 DIAGNOSIS — I10 Essential (primary) hypertension: Secondary | ICD-10-CM

## 2021-09-14 ENCOUNTER — Telehealth: Payer: Self-pay | Admitting: Internal Medicine

## 2021-09-14 NOTE — Telephone Encounter (Signed)
Pt has Physical scheduled and no labs are in that need to be done.  Can you put in labs if she needs it and call her and let her know.  Thanks

## 2021-09-14 NOTE — Telephone Encounter (Signed)
Pt advised with verbal understanding  °

## 2021-09-22 ENCOUNTER — Encounter: Payer: Medicare HMO | Admitting: Internal Medicine

## 2021-10-27 ENCOUNTER — Ambulatory Visit (INDEPENDENT_AMBULATORY_CARE_PROVIDER_SITE_OTHER): Payer: Medicare HMO | Admitting: Internal Medicine

## 2021-10-27 ENCOUNTER — Encounter: Payer: Self-pay | Admitting: Internal Medicine

## 2021-10-27 VITALS — BP 136/68 | HR 74 | Resp 18 | Ht 66.0 in | Wt 164.8 lb

## 2021-10-27 DIAGNOSIS — J309 Allergic rhinitis, unspecified: Secondary | ICD-10-CM | POA: Diagnosis not present

## 2021-10-27 DIAGNOSIS — I10 Essential (primary) hypertension: Secondary | ICD-10-CM

## 2021-10-27 DIAGNOSIS — K2270 Barrett's esophagus without dysplasia: Secondary | ICD-10-CM | POA: Diagnosis not present

## 2021-10-27 DIAGNOSIS — F419 Anxiety disorder, unspecified: Secondary | ICD-10-CM

## 2021-10-27 DIAGNOSIS — E038 Other specified hypothyroidism: Secondary | ICD-10-CM

## 2021-10-27 DIAGNOSIS — N393 Stress incontinence (female) (male): Secondary | ICD-10-CM

## 2021-10-27 DIAGNOSIS — Z0001 Encounter for general adult medical examination with abnormal findings: Secondary | ICD-10-CM | POA: Insufficient documentation

## 2021-10-27 DIAGNOSIS — R69 Illness, unspecified: Secondary | ICD-10-CM | POA: Diagnosis not present

## 2021-10-27 MED ORDER — OMEPRAZOLE 40 MG PO CPDR: 40.0000 mg | DELAYED_RELEASE_CAPSULE | Freq: Every day | ORAL | 3 refills | Status: DC

## 2021-10-27 MED ORDER — LEVOTHYROXINE SODIUM 50 MCG PO TABS: ORAL_TABLET | ORAL | 3 refills | Status: DC

## 2021-10-27 MED ORDER — LOSARTAN POTASSIUM 25 MG PO TABS: 25.0000 mg | ORAL_TABLET | Freq: Every day | ORAL | 1 refills | Status: DC

## 2021-10-27 NOTE — Assessment & Plan Note (Signed)
Takes Xanax 0.5 - 1 mg QD PRN PDMP reviewed
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
## 2021-10-27 NOTE — Patient Instructions (Addendum)
Please continue to take medications as prescribed.  Please continue to follow DASH diet and ambulate as tolerated.  Please consider getting Tdap vaccine at your local pharmacy.  Please perform Kegel exercises for bladder pressure.

## 2021-10-27 NOTE — Assessment & Plan Note (Signed)
Follow up with Dr Fuller Plan On Omeprazole

## 2021-10-27 NOTE — Assessment & Plan Note (Signed)
Physical exam as documented. BMP and TSH, free T4 ordered. Advised to get Tdap vaccine at local pharmacy.

## 2021-10-27 NOTE — Assessment & Plan Note (Signed)
Lab Results  Component Value Date   TSH 3.580 06/10/2021   On Levothyroxine 50 mcg QD now

## 2021-10-27 NOTE — Assessment & Plan Note (Signed)
Well controlled with Allegra

## 2021-10-27 NOTE — Assessment & Plan Note (Signed)
BP Readings from Last 1 Encounters:  10/27/21 136/68   Well controlled with losartan 25 mg QD Counseled for compliance with the medications Advised DASH diet and moderate exercise/walking, at least 150 mins/week

## 2021-10-27 NOTE — Assessment & Plan Note (Signed)
Mild bladder pressure when lying down Advised to perform Kegel exercises

## 2021-10-28 LAB — TSH+FREE T4
Free T4: 1.57 ng/dL (ref 0.82–1.77)
TSH: 2.09 u[IU]/mL (ref 0.450–4.500)

## 2021-10-28 LAB — BASIC METABOLIC PANEL
BUN/Creatinine Ratio: 26 (ref 12–28)
BUN: 19 mg/dL (ref 8–27)
CO2: 22 mmol/L (ref 20–29)
Calcium: 9.7 mg/dL (ref 8.7–10.3)
Chloride: 105 mmol/L (ref 96–106)
Creatinine, Ser: 0.74 mg/dL (ref 0.57–1.00)
Glucose: 71 mg/dL (ref 70–99)
Potassium: 4.6 mmol/L (ref 3.5–5.2)
Sodium: 143 mmol/L (ref 134–144)
eGFR: 88 mL/min/{1.73_m2} (ref >59)

## 2021-11-01 ENCOUNTER — Ambulatory Visit (INDEPENDENT_AMBULATORY_CARE_PROVIDER_SITE_OTHER): Payer: Medicare HMO | Admitting: *Deleted

## 2021-11-01 DIAGNOSIS — Z0000 Encounter for general adult medical examination without abnormal findings: Secondary | ICD-10-CM
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
## 2021-11-01 NOTE — Patient Instructions (Signed)
Courtney Tapia , Thank you for taking time to come for your Medicare Wellness Visit. I appreciate your ongoing commitment to your health goals. Please review the following plan we discussed and let me know if I can assist you in the future.   Screening recommendations/referrals: Colonoscopy: due 08-12-24 Mammogram: due 05-12-22 Bone Density: due 09-24-22 Recommended yearly ophthalmology/optometry visit for glaucoma screening and checkup Recommended yearly dental visit for hygiene and checkup  Vaccinations: Influenza vaccine: completed Pneumococcal vaccine: completed Tdap vaccine: due now Shingles vaccine: completed    Advanced directives: copy requested  Conditions/risks identified: hypertension, falls  Next appointment: 1 year    Preventive Care 34 Years and Older, Female Preventive care refers to lifestyle choices and visits with your health care provider that can promote health and wellness. What does preventive care include? A yearly physical exam. This is also called an annual well check. Dental exams once or twice a year. Routine eye exams. Ask your health care provider how often you should have your eyes checked. Personal lifestyle choices, including: Daily care of your teeth and gums. Regular physical activity. Eating a healthy diet. Avoiding tobacco and drug use. Limiting alcohol use. Practicing safe sex. Taking low-dose aspirin every day. Taking vitamin and mineral supplements as recommended by your health care provider. What happens during an annual well check? The services and screenings done by your health care provider during your annual well check will depend on your age, overall health, lifestyle risk factors, and family history of disease. Counseling  Your health care provider may ask you questions about your: Alcohol use. Tobacco use. Drug use. Emotional well-being. Home and relationship well-being. Sexual activity. Eating habits. History of falls. Memory  and ability to understand (cognition). Work and work Statistician. Reproductive health. Screening  You may have the following tests or measurements: Height, weight, and BMI. Blood pressure. Lipid and cholesterol levels. These may be checked every 5 years, or more frequently if you are over 72 years old. Skin check. Lung cancer screening. You may have this screening every year starting at age 2 if you have a 30-pack-year history of smoking and currently smoke or have quit within the past 15 years. Fecal occult blood test (FOBT) of the stool. You may have this test every year starting at age 30. Flexible sigmoidoscopy or colonoscopy. You may have a sigmoidoscopy every 5 years or a colonoscopy every 10 years starting at age 24. Hepatitis C blood test. Hepatitis B blood test. Sexually transmitted disease (STD) testing. Diabetes screening. This is done by checking your blood sugar (glucose) after you have not eaten for a while (fasting). You may have this done every 1-3 years. Bone density scan. This is done to screen for osteoporosis. You may have this done starting at age 90. Mammogram. This may be done every 1-2 years. Talk to your health care provider about how often you should have regular mammograms. Talk with your health care provider about your test results, treatment options, and if necessary, the need for more tests. Vaccines  Your health care provider may recommend certain vaccines, such as: Influenza vaccine. This is recommended every year. Tetanus, diphtheria, and acellular pertussis (Tdap, Td) vaccine. You may need a Td booster every 10 years. Zoster vaccine. You may need this after age 48. Pneumococcal 13-valent conjugate (PCV13) vaccine. One dose is recommended after age 70. Pneumococcal polysaccharide (PPSV23) vaccine. One dose is recommended after age 74. Talk to your health care provider about which screenings and vaccines you need and how  often you need them. This  information is not intended to replace advice given to you by your health care provider. Make sure you discuss any questions you have with your health care provider. Document Released: 05/01/2015 Document Revised: 12/23/2015 Document Reviewed: 02/03/2015 Elsevier Interactive Patient Education  2017 [place] Prevention in the Home Falls can cause injuries. They can happen to people of all ages. There are many things you can do to make your home safe and to help prevent falls. What can I do on the outside of my home? Regularly fix the edges of walkways and driveways and fix any cracks. Remove anything that might make you trip as you walk through a door, such as a raised step or threshold. Trim any bushes or trees on the path to your home. Use bright outdoor lighting. Clear any walking paths of anything that might make someone trip, such as rocks or tools. Regularly check to see if handrails are loose or broken. Make sure that both sides of any steps have handrails. Any raised decks and porches should have guardrails on the edges. Have any leaves, snow, or ice cleared regularly. Use sand or salt on walking paths during winter. Clean up any spills in your garage right away. This includes oil or grease spills. What can I do in the bathroom? Use night lights. Install grab bars by the toilet and in the tub and shower. Do not use towel bars as grab bars. Use non-skid mats or decals in the tub or shower. If you need to sit down in the shower, use a plastic, non-slip stool. Keep the floor dry. Clean up any water that spills on the floor as soon as it happens. Remove soap buildup in the tub or shower regularly. Attach bath mats securely with double-sided non-slip rug tape. Do not have throw rugs and other things on the floor that can make you trip. What can I do in the bedroom? Use night lights. Make sure that you have a light by your bed that is easy to reach. Do not use any sheets or  blankets that are too big for your bed. They should not hang down onto the floor. Have a firm chair that has side arms. You can use this for support while you get dressed. Do not have throw rugs and other things on the floor that can make you trip. What can I do in the kitchen? Clean up any spills right away. Avoid walking on wet floors. Keep items that you use a lot in easy-to-reach places. If you need to reach something above you, use a strong step stool that has a grab bar. Keep electrical cords out of the way. Do not use floor polish or wax that makes floors slippery. If you must use wax, use non-skid floor wax. Do not have throw rugs and other things on the floor that can make you trip. What can I do with my stairs? Do not leave any items on the stairs. Make sure that there are handrails on both sides of the stairs and use them. Fix handrails that are broken or loose. Make sure that handrails are as long as the stairways. Check any carpeting to make sure that it is firmly attached to the stairs. Fix any carpet that is loose or worn. Avoid having throw rugs at the top or bottom of the stairs. If you do have throw rugs, attach them to the floor with carpet tape. Make sure that you have a  light switch at the top of the stairs and the bottom of the stairs. If you do not have them, ask someone to add them for you. What else can I do to help prevent falls? Wear shoes that: Do not have high heels. Have rubber bottoms. Are comfortable and fit you well. Are closed at the toe. Do not wear sandals. If you use a stepladder: Make sure that it is fully opened. Do not climb a closed stepladder. Make sure that both sides of the stepladder are locked into place. Ask someone to hold it for you, if possible. Clearly mark and make sure that you can see: Any grab bars or handrails. First and last steps. Where the edge of each step is. Use tools that help you move around (mobility aids) if they are  needed. These include: Canes. Walkers. Scooters. Crutches. Turn on the lights when you go into a dark area. Replace any light bulbs as soon as they burn out. Set up your furniture so you have a clear path. Avoid moving your furniture around. If any of your floors are uneven, fix them. If there are any pets around you, be aware of where they are. Review your medicines with your doctor. Some medicines can make you feel dizzy. This can increase your chance of falling. Ask your doctor what other things that you can do to help prevent falls. This information is not intended to replace advice given to you by your health care provider. Make sure you discuss any questions you have with your health care provider. Document Released: 01/29/2009 Document Revised: 09/10/2015 Document Reviewed: 05/09/2014 Elsevier Interactive Patient Education  2017 [employer].

## 2021-11-05 ENCOUNTER — Telehealth: Payer: Self-pay | Admitting: Internal Medicine

## 2021-11-05 NOTE — Telephone Encounter (Signed)
Patient needs refill on   ALPRAZolam (XANAX) 1 MG tablet   levothyroxine (SYNTHROID) 50 MCG tablet  [facility] [address]

## 2021-11-08 ENCOUNTER — Other Ambulatory Visit: Payer: Self-pay

## 2021-11-08 ENCOUNTER — Other Ambulatory Visit: Payer: Self-pay | Admitting: Internal Medicine

## 2021-11-08 DIAGNOSIS — F419 Anxiety disorder, unspecified: Secondary | ICD-10-CM

## 2021-11-08 DIAGNOSIS — E038 Other specified hypothyroidism: Secondary | ICD-10-CM

## 2021-11-08 MED ORDER — LEVOTHYROXINE SODIUM 50 MCG PO TABS: ORAL_TABLET | ORAL | 3 refills | Status: DC

## 2021-11-08 MED ORDER — ALPRAZOLAM 1 MG PO TABS: 0.5000 mg | ORAL_TABLET | Freq: Every day | ORAL | 1 refills | Status: DC | PRN

## 2021-11-24 ENCOUNTER — Encounter: Payer: Self-pay | Admitting: Internal Medicine

## 2021-11-24 ENCOUNTER — Ambulatory Visit (INDEPENDENT_AMBULATORY_CARE_PROVIDER_SITE_OTHER): Payer: Medicare HMO | Admitting: Internal Medicine

## 2021-11-24 VITALS — BP 132/78 | HR 71 | Resp 18 | Ht 66.0 in | Wt 163.4 lb

## 2021-11-24 DIAGNOSIS — N3000 Acute cystitis without hematuria: Secondary | ICD-10-CM

## 2021-11-24 DIAGNOSIS — R300 Dysuria: Secondary | ICD-10-CM | POA: Diagnosis not present

## 2021-11-24 LAB — POCT URINALYSIS DIP (CLINITEK)
Bilirubin, UA: NEGATIVE
Glucose, UA: NEGATIVE mg/dL
Ketones, POC UA: NEGATIVE mg/dL
Nitrite, UA: NEGATIVE
POC PROTEIN,UA: NEGATIVE
Spec Grav, UA: 1.01 (ref 1.010–1.025)
Urobilinogen, UA: 0.2 E.U./dL
pH, UA: 6.5 (ref 5.0–8.0)

## 2021-11-24 MED ORDER — NITROFURANTOIN MONOHYD MACRO 100 MG PO CAPS: 100.0000 mg | ORAL_CAPSULE | Freq: Two times a day (BID) | ORAL | 0 refills | Status: DC

## 2021-11-24 NOTE — Assessment & Plan Note (Addendum)
UA reviewed Check urine culture Started empiric Macrobid Advised to maintain adequate hydration
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
## 2021-11-28 LAB — URINE CULTURE

## 2021-12-01 ENCOUNTER — Telehealth: Payer: Self-pay | Admitting: Internal Medicine

## 2021-12-01 ENCOUNTER — Other Ambulatory Visit: Payer: Self-pay | Admitting: Internal Medicine

## 2021-12-01 DIAGNOSIS — N3000 Acute cystitis without hematuria: Secondary | ICD-10-CM

## 2021-12-01 MED ORDER — CIPROFLOXACIN HCL 500 MG PO TABS: 500.0000 mg | ORAL_TABLET | Freq: Two times a day (BID) | ORAL | 0 refills | Status: AC

## 2021-12-01 NOTE — Telephone Encounter (Signed)
LVM for patient

## 2021-12-01 NOTE — Telephone Encounter (Signed)
Patient called in regard to last visit on 8/9   Patient still experiencing UTI symptoms.  Patient will be heading out of town today and wants to know if provider can send her in another dose of antibiotic to fully clear up UTI.  Patient wants a call back in regard.

## 2022-03-24 ENCOUNTER — Telehealth: Payer: Self-pay | Admitting: Internal Medicine

## 2022-03-24 NOTE — Telephone Encounter (Signed)
Patent called in regard to BP   Patient has noticed bp is high. Wants a call back in regard.

## 2022-03-25 ENCOUNTER — Ambulatory Visit (INDEPENDENT_AMBULATORY_CARE_PROVIDER_SITE_OTHER): Payer: Medicare HMO | Admitting: Internal Medicine

## 2022-03-25 ENCOUNTER — Encounter: Payer: Self-pay | Admitting: Internal Medicine

## 2022-03-25 VITALS — BP 154/74 | HR 67 | Ht 66.0 in | Wt 165.4 lb

## 2022-03-25 DIAGNOSIS — F419 Anxiety disorder, unspecified: Secondary | ICD-10-CM | POA: Diagnosis not present

## 2022-03-25 DIAGNOSIS — I10 Essential (primary) hypertension: Secondary | ICD-10-CM

## 2022-03-25 DIAGNOSIS — E559 Vitamin D deficiency, unspecified: Secondary | ICD-10-CM

## 2022-03-25 DIAGNOSIS — E038 Other specified hypothyroidism: Secondary | ICD-10-CM

## 2022-03-25 DIAGNOSIS — R7303 Prediabetes: Secondary | ICD-10-CM | POA: Diagnosis not present

## 2022-03-25 DIAGNOSIS — E782 Mixed hyperlipidemia: Secondary | ICD-10-CM | POA: Diagnosis not present

## 2022-03-25 DIAGNOSIS — R69 Illness, unspecified: Secondary | ICD-10-CM | POA: Diagnosis not present

## 2022-03-25 MED ORDER — LOSARTAN POTASSIUM 50 MG PO TABS: 50.0000 mg | ORAL_TABLET | Freq: Every day | ORAL | 0 refills | Status: DC

## 2022-03-25 MED ORDER — ALPRAZOLAM 1 MG PO TABS: 0.5000 mg | ORAL_TABLET | Freq: Every day | ORAL | 1 refills | Status: DC | PRN

## 2022-03-25 NOTE — Assessment & Plan Note (Addendum)
Lab Results  Component Value Date   TSH 2.090 10/27/2021   On Levothyroxine 50 mcg QD now Check TSH and free T4

## 2022-03-25 NOTE — Assessment & Plan Note (Addendum)
Takes Xanax 0.5 - 1 mg QD PRN, refilled Worsening of anxiety or panic attack can also cause elevated high blood pressure - advised to take Xanax 1 mg nightly as needed PDMP reviewed

## 2022-03-25 NOTE — Patient Instructions (Signed)
Please start taking Losartan 50 mg once daily.  Please follow DASH diet and perform moderate exercise/walking at least 150 mins/week.

## 2022-03-25 NOTE — Assessment & Plan Note (Signed)
Reviewed last lipid profile Continue to follow low cholesterol diet
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
## 2022-03-25 NOTE — Assessment & Plan Note (Signed)
BP Readings from Last 1 Encounters:  03/25/22 (!) 154/74   Uncontrolled with losartan 25 mg QD Increased dose of losartan to 50 mg QD Counseled for compliance with the medications Advised DASH diet and moderate exercise/walking, at least 150 mins/week  EKG: Sinus bradycardia. No signs of active ischemia.

## 2022-04-22 DIAGNOSIS — E038 Other specified hypothyroidism: Secondary | ICD-10-CM | POA: Diagnosis not present

## 2022-04-22 DIAGNOSIS — E782 Mixed hyperlipidemia: Secondary | ICD-10-CM | POA: Diagnosis not present

## 2022-04-22 DIAGNOSIS — R7303 Prediabetes: Secondary | ICD-10-CM | POA: Diagnosis not present

## 2022-04-22 DIAGNOSIS — E559 Vitamin D deficiency, unspecified: Secondary | ICD-10-CM | POA: Diagnosis not present

## 2022-04-22 DIAGNOSIS — I10 Essential (primary) hypertension: Secondary | ICD-10-CM | POA: Diagnosis not present

## 2022-04-23 LAB — CBC WITH DIFFERENTIAL/PLATELET
Basophils Absolute: 0 10*3/uL (ref 0.0–0.2)
Basos: 1 %
EOS (ABSOLUTE): 0.2 10*3/uL (ref 0.0–0.4)
Eos: 4 %
Hematocrit: 39.1 % (ref 34.0–46.6)
Hemoglobin: 13 g/dL (ref 11.1–15.9)
Immature Grans (Abs): 0 10*3/uL (ref 0.0–0.1)
Immature Granulocytes: 0 %
Lymphocytes Absolute: 2.3 10*3/uL (ref 0.7–3.1)
Lymphs: 41 %
MCH: 30.7 pg (ref 26.6–33.0)
MCHC: 33.2 g/dL (ref 31.5–35.7)
MCV: 92 fL (ref 79–97)
Monocytes Absolute: 0.5 10*3/uL (ref 0.1–0.9)
Monocytes: 8 %
Neutrophils Absolute: 2.6 10*3/uL (ref 1.4–7.0)
Neutrophils: 46 %
Platelets: 300 10*3/uL (ref 150–450)
RBC: 4.23 x10E6/uL (ref 3.77–5.28)
RDW: 12.2 % (ref 11.7–15.4)
WBC: 5.6 10*3/uL (ref 3.4–10.8)

## 2022-04-23 LAB — CMP14+EGFR
ALT: 20 IU/L (ref 0–32)
AST: 20 IU/L (ref 0–40)
Albumin/Globulin Ratio: 2 (ref 1.2–2.2)
Albumin: 4.4 g/dL (ref 3.9–4.9)
Alkaline Phosphatase: 65 IU/L (ref 44–121)
BUN/Creatinine Ratio: 28 (ref 12–28)
BUN: 20 mg/dL (ref 8–27)
Bilirubin Total: 0.8 mg/dL (ref 0.0–1.2)
CO2: 26 mmol/L (ref 20–29)
Calcium: 9.5 mg/dL (ref 8.7–10.3)
Chloride: 101 mmol/L (ref 96–106)
Creatinine, Ser: 0.71 mg/dL (ref 0.57–1.00)
Globulin, Total: 2.2 g/dL (ref 1.5–4.5)
Glucose: 93 mg/dL (ref 70–99)
Potassium: 4.2 mmol/L (ref 3.5–5.2)
Sodium: 140 mmol/L (ref 134–144)
Total Protein: 6.6 g/dL (ref 6.0–8.5)
eGFR: 92 mL/min/{1.73_m2} (ref >59)

## 2022-04-23 LAB — LIPID PANEL
Chol/HDL Ratio: 2.7 ratio (ref 0.0–4.4)
Cholesterol, Total: 221 mg/dL — ABNORMAL HIGH (ref 100–199)
HDL: 81 mg/dL (ref >39)
LDL Chol Calc (NIH): 130 mg/dL — ABNORMAL HIGH (ref 0–99)
Triglycerides: 58 mg/dL (ref 0–149)
VLDL Cholesterol Cal: 10 mg/dL (ref 5–40)

## 2022-04-23 LAB — HEMOGLOBIN A1C
Est. average glucose Bld gHb Est-mCnc: 108 mg/dL
Hgb A1c MFr Bld: 5.4 % (ref 4.8–5.6)

## 2022-04-23 LAB — TSH+FREE T4
Free T4: 1.43 ng/dL (ref 0.82–1.77)
TSH: 3.58 u[IU]/mL (ref 0.450–4.500)

## 2022-04-23 LAB — VITAMIN D 25 HYDROXY (VIT D DEFICIENCY, FRACTURES): Vit D, 25-Hydroxy: 52 ng/mL (ref 30.0–100.0)

## 2022-04-29 ENCOUNTER — Ambulatory Visit (INDEPENDENT_AMBULATORY_CARE_PROVIDER_SITE_OTHER): Payer: Medicare HMO | Admitting: Internal Medicine

## 2022-04-29 ENCOUNTER — Encounter: Payer: Self-pay | Admitting: Internal Medicine

## 2022-04-29 VITALS — BP 136/66 | HR 65 | Ht 66.0 in | Wt 160.2 lb

## 2022-04-29 DIAGNOSIS — I10 Essential (primary) hypertension: Secondary | ICD-10-CM

## 2022-04-29 DIAGNOSIS — R69 Illness, unspecified: Secondary | ICD-10-CM | POA: Diagnosis not present

## 2022-04-29 DIAGNOSIS — E038 Other specified hypothyroidism: Secondary | ICD-10-CM

## 2022-04-29 DIAGNOSIS — E782 Mixed hyperlipidemia: Secondary | ICD-10-CM | POA: Diagnosis not present

## 2022-04-29 DIAGNOSIS — F419 Anxiety disorder, unspecified: Secondary | ICD-10-CM

## 2022-04-29 MED ORDER — LOSARTAN POTASSIUM 50 MG PO TABS: 50.0000 mg | ORAL_TABLET | Freq: Every day | ORAL | 1 refills | Status: DC

## 2022-04-29 NOTE — Assessment & Plan Note (Signed)
Reviewed last lipid profile Continue to follow low cholesterol diet
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
## 2022-04-29 NOTE — Assessment & Plan Note (Signed)
Lab Results  Component Value Date   TSH 3.580 04/22/2022   On Levothyroxine 50 mcg QD now Check TSH and free T4

## 2022-04-29 NOTE — Assessment & Plan Note (Signed)
Takes Xanax 0.5 - 1 mg QD PRN, refilled Worsening of anxiety or panic attack can also cause elevated high blood pressure - advised to take Xanax 1 mg nightly as needed PDMP reviewed

## 2022-04-29 NOTE — Assessment & Plan Note (Signed)
BP Readings from Last 1 Encounters:  04/29/22 136/66   Well-controlled with losartan 50 mg QD Counseled for compliance with the medications Advised DASH diet and moderate exercise/walking, at least 150 mins/week  EKG: Sinus bradycardia. No signs of active ischemia.

## 2022-04-29 NOTE — Patient Instructions (Signed)
Please continue taking medications as prescribed.  Please continue to follow DASH diet and perform moderate exercise/walking at least 150 mins/week.

## 2022-05-04 ENCOUNTER — Other Ambulatory Visit (HOSPITAL_COMMUNITY): Payer: Self-pay | Admitting: Internal Medicine

## 2022-05-04 DIAGNOSIS — Z1231 Encounter for screening mammogram for malignant neoplasm of breast: Secondary | ICD-10-CM

## 2022-05-16 ENCOUNTER — Ambulatory Visit (HOSPITAL_COMMUNITY): Payer: Medicare HMO

## 2022-06-01 ENCOUNTER — Ambulatory Visit (HOSPITAL_COMMUNITY): Payer: Medicare HMO

## 2022-06-15 ENCOUNTER — Ambulatory Visit (HOSPITAL_COMMUNITY): Payer: Medicare HMO

## 2022-06-15 ENCOUNTER — Encounter (HOSPITAL_COMMUNITY): Payer: Self-pay

## 2022-06-15 ENCOUNTER — Ambulatory Visit (HOSPITAL_COMMUNITY)
Admission: RE | Admit: 2022-06-15 | Discharge: 2022-06-15 | Disposition: A | Discharge status: Home / Self Care | Payer: Medicare HMO | Source: Ambulatory Visit | Attending: Internal Medicine | Admitting: Internal Medicine

## 2022-06-15 DIAGNOSIS — Z1231 Encounter for screening mammogram for malignant neoplasm of breast: Secondary | ICD-10-CM | POA: Insufficient documentation

## 2022-07-18 ENCOUNTER — Other Ambulatory Visit: Payer: Self-pay | Admitting: Internal Medicine

## 2022-07-18 DIAGNOSIS — F419 Anxiety disorder, unspecified: Secondary | ICD-10-CM

## 2022-08-31 ENCOUNTER — Ambulatory Visit: Payer: Medicare HMO | Admitting: Internal Medicine

## 2022-09-02 ENCOUNTER — Ambulatory Visit: Payer: Medicare HMO | Admitting: Internal Medicine

## 2022-09-06 ENCOUNTER — Ambulatory Visit: Payer: Medicare HMO | Admitting: Obstetrics & Gynecology

## 2022-10-03 ENCOUNTER — Ambulatory Visit: Payer: Medicare HMO | Admitting: Internal Medicine

## 2022-10-11 ENCOUNTER — Encounter: Payer: Self-pay | Admitting: Internal Medicine

## 2022-10-11 ENCOUNTER — Ambulatory Visit (INDEPENDENT_AMBULATORY_CARE_PROVIDER_SITE_OTHER): Payer: Medicare HMO | Admitting: Internal Medicine

## 2022-10-11 VITALS — BP 158/56 | HR 66 | Ht 66.0 in | Wt 159.2 lb

## 2022-10-11 DIAGNOSIS — F419 Anxiety disorder, unspecified: Secondary | ICD-10-CM

## 2022-10-11 DIAGNOSIS — E038 Other specified hypothyroidism: Secondary | ICD-10-CM | POA: Diagnosis not present

## 2022-10-11 DIAGNOSIS — K2270 Barrett's esophagus without dysplasia: Secondary | ICD-10-CM | POA: Diagnosis not present

## 2022-10-11 DIAGNOSIS — G4700 Insomnia, unspecified: Secondary | ICD-10-CM

## 2022-10-11 DIAGNOSIS — I10 Essential (primary) hypertension: Secondary | ICD-10-CM

## 2022-10-11 DIAGNOSIS — E782 Mixed hyperlipidemia: Secondary | ICD-10-CM

## 2022-10-11 NOTE — Assessment & Plan Note (Signed)
Lab Results  Component Value Date   TSH 3.580 04/22/2022   On Levothyroxine 50 mcg QD now Check TSH and free T4 

## 2022-10-11 NOTE — Assessment & Plan Note (Signed)
Takes Xanax 0.5 - 1 mg QD PRN, refilled Worsening of anxiety or panic attack can also cause elevated high blood pressure - advised to take Xanax 1 mg nightly as needed PDMP reviewed 

## 2022-10-11 NOTE — Assessment & Plan Note (Signed)
Reviewed last lipid profile Continue to follow low cholesterol diet 

## 2022-10-11 NOTE — Assessment & Plan Note (Addendum)
Did not improve with Trazodone, had prescribed Lunesta, but she did not try Takes Xanax 0.5 - 1 mg QD PRN for anxiety PDMP reviewed

## 2022-10-11 NOTE — Assessment & Plan Note (Signed)
Follow up with Dr Stark On Omeprazole 

## 2022-10-11 NOTE — Patient Instructions (Signed)
Please continue to take medications as prescribed.  Please continue to follow low carb diet and perform moderate exercise/walking at least 150 mins/week.  Please get fasting blood tests done before the next visit. 
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
## 2022-10-11 NOTE — Assessment & Plan Note (Addendum)
BP Readings from Last 1 Encounters:  10/11/22 (!) 156/91   Uncontrolled with losartan 50 mg every day, she attributes it to being anxious Home BP readings WNL, advised to keep log and bring in the next visit If persistently elevated BP, plan to increase dose of losartan Counseled for compliance with the medications Advised DASH diet and moderate exercise/walking, at least 150 mins/week

## 2022-10-24 ENCOUNTER — Other Ambulatory Visit: Payer: Self-pay | Admitting: Internal Medicine

## 2022-10-24 DIAGNOSIS — K2270 Barrett's esophagus without dysplasia: Secondary | ICD-10-CM

## 2022-10-24 DIAGNOSIS — I10 Essential (primary) hypertension: Secondary | ICD-10-CM

## 2022-11-16 ENCOUNTER — Ambulatory Visit (INDEPENDENT_AMBULATORY_CARE_PROVIDER_SITE_OTHER): Payer: Medicare HMO

## 2022-11-16 VITALS — BP 124/67 | Ht 66.0 in | Wt 159.0 lb

## 2022-11-16 DIAGNOSIS — Z0100 Encounter for examination of eyes and vision without abnormal findings: Secondary | ICD-10-CM

## 2022-11-16 DIAGNOSIS — Z0000 Encounter for general adult medical examination without abnormal findings: Secondary | ICD-10-CM
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
## 2022-11-16 NOTE — Patient Instructions (Signed)
Courtney Tapia , Thank you for taking time to come for your Medicare Wellness Visit. I appreciate your ongoing commitment to your health goals. Please review the following plan we discussed and let me know if I can assist you in the future.   These are the goals we discussed:  Goals      Increase physical activity     Would like to just keep going as is      Patient Stated     Would like to keep babysitting and moving      Patient Stated     To have a fantastic birthday (its her 72th) with friends and family!        This is a list of the screening recommended for you and due dates:  Health Maintenance  Topic Date Due   COVID-19 Vaccine (4 - 2023-24 season) 12/17/2021   Flu Shot  11/17/2022   Medicare Annual Wellness Visit  11/16/2023   Mammogram  06/15/2024   Colon Cancer Screening  08/12/2024   DTaP/Tdap/Td vaccine (2 - Td or Tdap) 11/11/2031   Pneumonia Vaccine  Completed   DEXA scan (bone density measurement)  Completed   Hepatitis C Screening  Completed   Zoster (Shingles) Vaccine  Completed   HPV Vaccine  Aged Out    Advanced directives: Advance directive discussed with you today. Even though you declined this today, please call our office should you change your mind, and we can give you the proper paperwork for you to fill out. Advance care planning is a way to make decisions about medical care that fits your values in case you are ever unable to make these decisions for yourself.  Information on Advanced Care Planning can be found at [facility] Advance Health Care Directives Advance Health Care Directives ([email or web address]    Conditions/risks identified:  You have been referred to [facility] for a complete eye exam. If you haven't heard from them in a few days, please call them to schedule your appointment.  [facility] [address] [place] [address] Phone: [phone]   Next appointment: VIRTUAL/TELEPHONE APPOINTMENT Follow up in  one year for your annual wellness visit  January 03, 2024 at 9:00am telephone visit   Preventive Care 65 Years and Older, Female Preventive care refers to lifestyle choices and visits with your health care provider that can promote health and wellness. What does preventive care include? A yearly physical exam. This is also called an annual well check. Dental exams once or twice a year. Routine eye exams. Ask your health care provider how often you should have your eyes checked. Personal lifestyle choices, including: Daily care of your teeth and gums. Regular physical activity. Eating a healthy diet. Avoiding tobacco and drug use. Limiting alcohol use. Practicing safe sex. Taking low-dose aspirin every day. Taking vitamin and mineral supplements as recommended by your health care provider. What happens during an annual well check? The services and screenings done by your health care provider during your annual well check will depend on your age, overall health, lifestyle risk factors, and family history of disease. Counseling  Your health care provider may ask you questions about your: Alcohol use. Tobacco use. Drug use. Emotional well-being. Home and relationship well-being. Sexual activity. Eating habits. History of falls. Memory and ability to understand (cognition). Work and work Astronomer. Reproductive health. Screening  You may have the following tests or measurements: Height, weight, and BMI. Blood pressure. Lipid  and cholesterol levels. These may be checked every 5 years, or more frequently if you are over 53 years old. Skin check. Lung cancer screening. You may have this screening every year starting at age 29 if you have a 30-pack-year history of smoking and currently smoke or have quit within the past 15 years. Fecal occult blood test (FOBT) of the stool. You may have this test every year starting at age 64. Flexible sigmoidoscopy or colonoscopy. You may have a  sigmoidoscopy every 5 years or a colonoscopy every 10 years starting at age 52. Hepatitis C blood test. Hepatitis B blood test. Sexually transmitted disease (STD) testing. Diabetes screening. This is done by checking your blood sugar (glucose) after you have not eaten for a while (fasting). You may have this done every 1-3 years. Bone density scan. This is done to screen for osteoporosis. You may have this done starting at age 57. Mammogram. This may be done every 1-2 years. Talk to your health care provider about how often you should have regular mammograms. Talk with your health care provider about your test results, treatment options, and if necessary, the need for more tests. Vaccines  Your health care provider may recommend certain vaccines, such as: Influenza vaccine. This is recommended every year. Tetanus, diphtheria, and acellular pertussis (Tdap, Td) vaccine. You may need a Td booster every 10 years. Zoster vaccine. You may need this after age 75. Pneumococcal 13-valent conjugate (PCV13) vaccine. One dose is recommended after age 38. Pneumococcal polysaccharide (PPSV23) vaccine. One dose is recommended after age 83. Talk to your health care provider about which screenings and vaccines you need and how often you need them. This information is not intended to replace advice given to you by your health care provider. Make sure you discuss any questions you have with your health care provider. Document Released: 05/01/2015 Document Revised: 12/23/2015 Document Reviewed: 02/03/2015 Elsevier Interactive Patient Education  2017 [employer].  Fall Prevention in the Home Falls can cause injuries. They can happen to people of all ages. There are many things you can do to make your home safe and to help prevent falls. What can I do on the outside of my home? Regularly fix the edges of walkways and driveways and fix any cracks. Remove anything that might make you trip as you walk through a  door, such as a raised step or threshold. Trim any bushes or trees on the path to your home. Use bright outdoor lighting. Clear any walking paths of anything that might make someone trip, such as rocks or tools. Regularly check to see if handrails are loose or broken. Make sure that both sides of any steps have handrails. Any raised decks and porches should have guardrails on the edges. Have any leaves, snow, or ice cleared regularly. Use sand or salt on walking paths during winter. Clean up any spills in your garage right away. This includes oil or grease spills. What can I do in the bathroom? Use night lights. Install grab bars by the toilet and in the tub and shower. Do not use towel bars as grab bars. Use non-skid mats or decals in the tub or shower. If you need to sit down in the shower, use a plastic, non-slip stool. Keep the floor dry. Clean up any water that spills on the floor as soon as it happens. Remove soap buildup in the tub or shower regularly. Attach bath mats securely with double-sided non-slip rug tape. Do not have throw rugs and  other things on the floor that can make you trip. What can I do in the bedroom? Use night lights. Make sure that you have a light by your bed that is easy to reach. Do not use any sheets or blankets that are too big for your bed. They should not hang down onto the floor. Have a firm chair that has side arms. You can use this for support while you get dressed. Do not have throw rugs and other things on the floor that can make you trip. What can I do in the kitchen? Clean up any spills right away. Avoid walking on wet floors. Keep items that you use a lot in easy-to-reach places. If you need to reach something above you, use a strong step stool that has a grab bar. Keep electrical cords out of the way. Do not use floor polish or wax that makes floors slippery. If you must use wax, use non-skid floor wax. Do not have throw rugs and other things  on the floor that can make you trip. What can I do with my stairs? Do not leave any items on the stairs. Make sure that there are handrails on both sides of the stairs and use them. Fix handrails that are broken or loose. Make sure that handrails are as long as the stairways. Check any carpeting to make sure that it is firmly attached to the stairs. Fix any carpet that is loose or worn. Avoid having throw rugs at the top or bottom of the stairs. If you do have throw rugs, attach them to the floor with carpet tape. Make sure that you have a light switch at the top of the stairs and the bottom of the stairs. If you do not have them, ask someone to add them for you. What else can I do to help prevent falls? Wear shoes that: Do not have high heels. Have rubber bottoms. Are comfortable and fit you well. Are closed at the toe. Do not wear sandals. If you use a stepladder: Make sure that it is fully opened. Do not climb a closed stepladder. Make sure that both sides of the stepladder are locked into place. Ask someone to hold it for you, if possible. Clearly mark and make sure that you can see: Any grab bars or handrails. First and last steps. Where the edge of each step is. Use tools that help you move around (mobility aids) if they are needed. These include: Canes. Walkers. Scooters. Crutches. Turn on the lights when you go into a dark area. Replace any light bulbs as soon as they burn out. Set up your furniture so you have a clear path. Avoid moving your furniture around. If any of your floors are uneven, fix them. If there are any pets around you, be aware of where they are. Review your medicines with your doctor. Some medicines can make you feel dizzy. This can increase your chance of falling. Ask your doctor what other things that you can do to help prevent falls. This information is not intended to replace advice given to you by your health care provider. Make sure you discuss any  questions you have with your health care provider. Document Released: 01/29/2009 Document Revised: 09/10/2015 Document Reviewed: 05/09/2014 Elsevier Interactive Patient Education  2017 [employer].

## 2022-11-22 ENCOUNTER — Ambulatory Visit: Payer: Medicare HMO | Admitting: Internal Medicine

## 2022-11-22 ENCOUNTER — Encounter: Payer: Self-pay | Admitting: Internal Medicine

## 2022-11-22 ENCOUNTER — Ambulatory Visit (INDEPENDENT_AMBULATORY_CARE_PROVIDER_SITE_OTHER): Payer: Medicare HMO | Admitting: Internal Medicine

## 2022-11-22 VITALS — BP 134/68 | HR 58 | Ht 66.0 in | Wt 156.2 lb

## 2022-11-22 DIAGNOSIS — E782 Mixed hyperlipidemia: Secondary | ICD-10-CM | POA: Diagnosis not present

## 2022-11-22 DIAGNOSIS — F419 Anxiety disorder, unspecified: Secondary | ICD-10-CM | POA: Diagnosis not present

## 2022-11-22 DIAGNOSIS — E038 Other specified hypothyroidism: Secondary | ICD-10-CM

## 2022-11-22 DIAGNOSIS — M8580 Other specified disorders of bone density and structure, unspecified site: Secondary | ICD-10-CM

## 2022-11-22 DIAGNOSIS — Z0001 Encounter for general adult medical examination with abnormal findings: Secondary | ICD-10-CM | POA: Diagnosis not present

## 2022-11-22 DIAGNOSIS — I10 Essential (primary) hypertension: Secondary | ICD-10-CM

## 2022-11-22 NOTE — Assessment & Plan Note (Signed)
Lab Results  Component Value Date   TSH 3.580 04/22/2022   On Levothyroxine 50 mcg QD now Check TSH and free T4

## 2022-11-22 NOTE — Assessment & Plan Note (Signed)
Takes Xanax 0.5 - 1 mg QD PRN, refilled Worsening of anxiety or panic attack can also cause elevated high blood pressure - advised to take Xanax 1 mg nightly as needed PDMP reviewed

## 2022-11-22 NOTE — Assessment & Plan Note (Signed)
On calcium and Vitamin D supplements

## 2022-11-22 NOTE — Patient Instructions (Signed)
Please continue to take medications as prescribed.  Please continue to follow low salt diet and perform moderate exercise/walking at least 150 mins/week. 

## 2022-11-22 NOTE — Assessment & Plan Note (Addendum)
BP Readings from Last 1 Encounters:  11/22/22 134/68   Well-controlled with losartan 50 mg every day Home BP readings WNL, advised to keep log and bring in the next visit If persistently elevated BP, plan to increase dose of losartan Counseled for compliance with the medications Advised DASH diet and moderate exercise/walking, at least 150 mins/week

## 2022-11-22 NOTE — Assessment & Plan Note (Addendum)
Physical exam as documented. CMP and TSH, free T4 ordered. Advised to get Tdap vaccine at local pharmacy.
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
## 2022-11-23 ENCOUNTER — Telehealth: Payer: Self-pay | Admitting: Internal Medicine

## 2022-11-23 LAB — CMP14+EGFR
ALT: 16 [IU]/L (ref 0–32)
AST: 17 [IU]/L (ref 0–40)
Albumin: 4.3 g/dL (ref 3.9–4.9)
Alkaline Phosphatase: 64 [IU]/L (ref 44–121)
BUN/Creatinine Ratio: 24 (ref 12–28)
BUN: 19 mg/dL (ref 8–27)
Bilirubin Total: 0.9 mg/dL (ref 0.0–1.2)
CO2: 24 mmol/L (ref 20–29)
Calcium: 9.5 mg/dL (ref 8.7–10.3)
Chloride: 102 mmol/L (ref 96–106)
Creatinine, Ser: 0.8 mg/dL (ref 0.57–1.00)
Globulin, Total: 2.3 g/dL (ref 1.5–4.5)
Glucose: 88 mg/dL (ref 70–99)
Potassium: 4.3 mmol/L (ref 3.5–5.2)
Sodium: 140 mmol/L (ref 134–144)
Total Protein: 6.6 g/dL (ref 6.0–8.5)
eGFR: 80 mL/min/{1.73_m2}

## 2022-11-23 NOTE — Telephone Encounter (Signed)
Patient called left a voicemail returning lab results call

## 2022-11-23 NOTE — Telephone Encounter (Signed)
Spoke to patient

## 2022-12-07 ENCOUNTER — Other Ambulatory Visit (HOSPITAL_COMMUNITY): Payer: Medicare HMO

## 2022-12-14 ENCOUNTER — Other Ambulatory Visit: Payer: Self-pay | Admitting: Internal Medicine

## 2022-12-14 ENCOUNTER — Ambulatory Visit (HOSPITAL_COMMUNITY)
Admission: RE | Admit: 2022-12-14 | Discharge: 2022-12-14 | Disposition: A | Discharge status: Home / Self Care | Payer: Medicare HMO | Source: Ambulatory Visit | Attending: Internal Medicine | Admitting: Internal Medicine

## 2022-12-14 DIAGNOSIS — M8580 Other specified disorders of bone density and structure, unspecified site: Secondary | ICD-10-CM | POA: Diagnosis not present

## 2022-12-14 DIAGNOSIS — Z780 Asymptomatic menopausal state: Secondary | ICD-10-CM | POA: Diagnosis not present

## 2022-12-14 DIAGNOSIS — M810 Age-related osteoporosis without current pathological fracture: Secondary | ICD-10-CM | POA: Diagnosis not present

## 2022-12-14 DIAGNOSIS — M8589 Other specified disorders of bone density and structure, multiple sites: Secondary | ICD-10-CM | POA: Insufficient documentation

## 2022-12-14 MED ORDER — ALENDRONATE SODIUM 70 MG PO TABS: 70.0000 mg | ORAL_TABLET | ORAL | 11 refills | Status: DC

## 2022-12-15 ENCOUNTER — Other Ambulatory Visit: Payer: Self-pay | Admitting: Internal Medicine

## 2022-12-15 ENCOUNTER — Telehealth: Payer: Self-pay | Admitting: Internal Medicine

## 2022-12-15 DIAGNOSIS — E038 Other specified hypothyroidism: Secondary | ICD-10-CM

## 2022-12-15 DIAGNOSIS — F419 Anxiety disorder, unspecified: Secondary | ICD-10-CM

## 2022-12-15 NOTE — Telephone Encounter (Signed)
Patient called asking about the medication that was just given says does not take this medication, patient asking for a call back to discuss what meds to take she is having dental surgery coming up next wednesday. [phone]

## 2022-12-16 NOTE — Telephone Encounter (Signed)
Patient aware.

## 2023-03-24 ENCOUNTER — Ambulatory Visit (INDEPENDENT_AMBULATORY_CARE_PROVIDER_SITE_OTHER): Payer: Medicare HMO | Admitting: Internal Medicine

## 2023-03-24 ENCOUNTER — Encounter: Payer: Self-pay | Admitting: Internal Medicine

## 2023-03-24 VITALS — BP 164/70 | HR 63 | Ht 66.0 in | Wt 160.8 lb

## 2023-03-24 DIAGNOSIS — G8929 Other chronic pain: Secondary | ICD-10-CM | POA: Insufficient documentation

## 2023-03-24 DIAGNOSIS — M810 Age-related osteoporosis without current pathological fracture: Secondary | ICD-10-CM | POA: Diagnosis not present

## 2023-03-24 DIAGNOSIS — M25561 Pain in right knee: Secondary | ICD-10-CM | POA: Diagnosis not present

## 2023-03-24 DIAGNOSIS — E782 Mixed hyperlipidemia: Secondary | ICD-10-CM

## 2023-03-24 DIAGNOSIS — F419 Anxiety disorder, unspecified: Secondary | ICD-10-CM | POA: Diagnosis not present

## 2023-03-24 DIAGNOSIS — E038 Other specified hypothyroidism: Secondary | ICD-10-CM | POA: Diagnosis not present

## 2023-03-24 DIAGNOSIS — K2270 Barrett's esophagus without dysplasia: Secondary | ICD-10-CM

## 2023-03-24 DIAGNOSIS — I10 Essential (primary) hypertension: Secondary | ICD-10-CM

## 2023-03-24 MED ORDER — LOSARTAN POTASSIUM 50 MG PO TABS
75.0000 mg | ORAL_TABLET | Freq: Every day | ORAL | Status: DC
Start: 2023-03-24 — End: 2023-04-25

## 2023-03-24 MED ORDER — DICLOFENAC SODIUM 1 % EX GEL
4.0000 g | Freq: Four times a day (QID) | CUTANEOUS | 0 refills | Status: AC
Start: 2023-03-24 — End: ?

## 2023-03-24 NOTE — Assessment & Plan Note (Signed)
BP Readings from Last 1 Encounters:  03/24/23 (!) 164/70   Uncontrolled with losartan 50 mg QD Increased dose of losartan to 75 mg QD Advised to keep log of home BP readings and bring in the next visit Counseled for compliance with the medications Advised DASH diet and moderate exercise/walking, at least 150 mins/week

## 2023-03-24 NOTE — Assessment & Plan Note (Signed)
Takes Xanax 0.5 - 1 mg QD PRN, refilled Worsening of anxiety or panic attack can also cause elevated high blood pressure - advised to take Xanax 1 mg nightly as needed PDMP reviewed

## 2023-03-24 NOTE — Assessment & Plan Note (Signed)
Lab Results  Component Value Date   TSH 2.330 11/22/2022   On Levothyroxine 50 mcg QD now Check TSH and free T4

## 2023-03-24 NOTE — Patient Instructions (Signed)
Please start taking Losartan 75 mg once daily instead of 50 mg.  Apply Diclofenac gel as needed for knee pain. Okay to take Tylenol as needed for knee pain.  Please continue to take medications as prescribed.  Please continue to follow low salt diet and perform moderate exercise/walking at least 150 mins/week.
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
## 2023-03-24 NOTE — Assessment & Plan Note (Signed)
Chronic right knee pain, recently worse due to overexertion Advised to take Tylenol arthritis as needed Voltaren gel as needed for pain Refer to orthopedic surgery

## 2023-03-24 NOTE — Assessment & Plan Note (Signed)
Reviewed last lipid profile Continue to follow low cholesterol diet

## 2023-03-24 NOTE — Assessment & Plan Note (Addendum)
Had started Alendronate, but could not start due to recent dental work-up - advised to check with dental surgeon before starting it Continue Caltrate 600 + D3 twice daily

## 2023-03-24 NOTE — Assessment & Plan Note (Signed)
Follow up with Dr Fuller Plan On Omeprazole

## 2023-04-17 ENCOUNTER — Ambulatory Visit: Payer: Medicare HMO | Admitting: Orthopedic Surgery

## 2023-04-17 ENCOUNTER — Telehealth: Payer: Self-pay | Admitting: Orthopedic Surgery

## 2023-04-17 ENCOUNTER — Other Ambulatory Visit (INDEPENDENT_AMBULATORY_CARE_PROVIDER_SITE_OTHER): Payer: Self-pay

## 2023-04-17 VITALS — BP 146/75 | HR 63 | Ht 66.0 in | Wt 158.0 lb

## 2023-04-17 DIAGNOSIS — M545 Low back pain, unspecified: Secondary | ICD-10-CM | POA: Diagnosis not present

## 2023-04-17 DIAGNOSIS — M5410 Radiculopathy, site unspecified: Secondary | ICD-10-CM

## 2023-04-17 NOTE — Telephone Encounter (Signed)
Dr. Mort Sawyers pt - pt was here today for an appointment.  She lvm stating that she called her insurance about covering a MRI and injections.  She stated that they will cover and she'll be responsible for a copay.  She stated it does require a prior auth.  [phone]
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
## 2023-04-17 NOTE — Patient Instructions (Signed)
Gabapentin

## 2023-04-18 NOTE — Telephone Encounter (Signed)
Ok thanks 

## 2023-04-21 ENCOUNTER — Telehealth: Payer: Self-pay

## 2023-04-21 DIAGNOSIS — M545 Low back pain, unspecified: Secondary | ICD-10-CM

## 2023-04-21 DIAGNOSIS — M5410 Radiculopathy, site unspecified: Secondary | ICD-10-CM

## 2023-04-21 NOTE — Telephone Encounter (Signed)
 Patient left vm stating that she had spoke with her insurance about if they covered a MRI and a Epidural. Stated she speak with her insurance and was told she would have a copay, but they need a code for the Epidural. She is asking for a return call at [phone] or [phone]

## 2023-04-21 NOTE — Telephone Encounter (Signed)
 Left message for her to call back she is asking for codes but nothing is ordered so there are no codes to give her.

## 2023-04-21 NOTE — Telephone Encounter (Signed)
 Dr Romeo Apple has not ordered MRI or ESI for her.

## 2023-04-25 ENCOUNTER — Other Ambulatory Visit: Payer: Self-pay | Admitting: Internal Medicine

## 2023-04-25 DIAGNOSIS — F419 Anxiety disorder, unspecified: Secondary | ICD-10-CM

## 2023-04-25 DIAGNOSIS — I10 Essential (primary) hypertension: Secondary | ICD-10-CM

## 2023-04-25 NOTE — Telephone Encounter (Signed)
 Dr. Areatha - spoke w/the patient, she called again asking about codes.  I read her Amy's message.  She stated that is correct, Dr. Margrette has not ordered anything, but stated that Dr. Margrette told her to call her insurance and see if the injections are covered and she specifically mentioned epidural injections (I told her we do not do those here).  She is again wanting codes because the insurance is asking for them.  Advised Amy would be back tomorrow, she would like Amy to call her [phone] and if she can't be reached bc she'll be at work, she stated she is active on mychart and it's ok to respond/communicate via mychart.

## 2023-04-26 ENCOUNTER — Telehealth: Payer: Self-pay

## 2023-04-26 ENCOUNTER — Other Ambulatory Visit: Payer: Self-pay | Admitting: Internal Medicine

## 2023-04-26 DIAGNOSIS — M47816 Spondylosis without myelopathy or radiculopathy, lumbar region: Secondary | ICD-10-CM

## 2023-04-26 DIAGNOSIS — M545 Low back pain, unspecified: Secondary | ICD-10-CM | POA: Insufficient documentation

## 2023-04-26 MED ORDER — MELOXICAM 7.5 MG PO TABS: 7.5000 mg | ORAL_TABLET | Freq: Every day | ORAL | 0 refills | Status: DC

## 2023-04-26 NOTE — Telephone Encounter (Signed)
 Copied from CRM [id number]. Topic: Clinical - Medical Advice >> Apr 26, 2023  2:38 PM Powell B wrote: Reason for CRM: Patient calling because of R leg pain, had Lumbar spine xrays done last week, waiting on referral for neurology to receive epidural. Would like to know what can do or take for pain in the current time.

## 2023-04-26 NOTE — Telephone Encounter (Signed)
 Sent message to patient to ask if she wants to proceed with MRI  If she wants to is it okay to order?  I was out of the office when you saw her so I do not know what was discussed but looks like you mentioned lumbar MRI then injections if indicated.

## 2023-04-27 NOTE — Addendum Note (Signed)
 Addended byCaffie Damme on: 04/27/2023 09:38 AM   Modules accepted: Orders

## 2023-05-10 ENCOUNTER — Encounter: Payer: Self-pay | Admitting: Internal Medicine

## 2023-05-10 ENCOUNTER — Other Ambulatory Visit: Payer: Medicare HMO

## 2023-05-10 ENCOUNTER — Ambulatory Visit (INDEPENDENT_AMBULATORY_CARE_PROVIDER_SITE_OTHER): Payer: Medicare HMO | Admitting: Internal Medicine

## 2023-05-10 VITALS — BP 136/70 | HR 68 | Ht 66.0 in | Wt 156.4 lb

## 2023-05-10 DIAGNOSIS — I10 Essential (primary) hypertension: Secondary | ICD-10-CM

## 2023-05-10 DIAGNOSIS — M47816 Spondylosis without myelopathy or radiculopathy, lumbar region: Secondary | ICD-10-CM | POA: Diagnosis not present

## 2023-05-10 DIAGNOSIS — E038 Other specified hypothyroidism: Secondary | ICD-10-CM

## 2023-05-10 DIAGNOSIS — F419 Anxiety disorder, unspecified: Secondary | ICD-10-CM

## 2023-05-10 MED ORDER — LOSARTAN POTASSIUM 50 MG PO TABS: 75.0000 mg | ORAL_TABLET | Freq: Every day | ORAL | 1 refills | Status: DC

## 2023-05-10 NOTE — Patient Instructions (Signed)
Please continue taking Losartan 75 mg (1.5 tablet of 50 mg) once daily.  Please continue to take medications as prescribed.  Please continue to follow low salt diet and perform moderate exercise/walking at least 150 mins/week.

## 2023-05-10 NOTE — Assessment & Plan Note (Signed)
Takes Xanax 0.5 - 1 mg QD PRN, refilled Worsening of anxiety or panic attack can also cause elevated high blood pressure - advised to take Xanax 1 mg nightly as needed PDMP reviewed
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
## 2023-05-10 NOTE — Assessment & Plan Note (Signed)
 Lab Results  Component Value Date   TSH 2.330 11/22/2022   On Levothyroxine 50 mcg QD now Check TSH and free T4

## 2023-05-10 NOTE — Assessment & Plan Note (Addendum)
Recent x-ray and old MRI of lumbar spine reviewed -lumbar spondylosis with facet hypertrophy at L3-4 and foraminal narrowing at L4-5 Her LE pain is likely radicular pain from lumbar spondylosis Mobic as needed for back and knee pain Alternate with Tylenol arthritis Avoid heavy lifting and frequent bending Followed by orthopedic surgeon - planned to get repeat MRI of lumbar spine

## 2023-05-10 NOTE — Assessment & Plan Note (Signed)
BP Readings from Last 1 Encounters:  05/10/23 136/70   Well-controlled with losartan 75 mg once daily now Advised to keep log of home BP readings and bring in the next visit Counseled for compliance with the medications Advised DASH diet and moderate exercise/walking, at least 150 mins/week

## 2023-05-11 ENCOUNTER — Ambulatory Visit (HOSPITAL_COMMUNITY): Payer: Medicare HMO

## 2023-05-11 LAB — BASIC METABOLIC PANEL
BUN/Creatinine Ratio: 24 (ref 12–28)
BUN: 22 mg/dL (ref 8–27)
CO2: 24 mmol/L (ref 20–29)
Calcium: 9.3 mg/dL (ref 8.7–10.3)
Chloride: 101 mmol/L (ref 96–106)
Creatinine, Ser: 0.9 mg/dL (ref 0.57–1.00)
Glucose: 87 mg/dL (ref 70–99)
Potassium: 4.4 mmol/L (ref 3.5–5.2)
Sodium: 138 mmol/L (ref 134–144)
eGFR: 69 mL/min/{1.73_m2} (ref >59)

## 2023-05-11 LAB — TSH+FREE T4
Free T4: 1.49 ng/dL (ref 0.82–1.77)
TSH: 2.12 u[IU]/mL (ref 0.450–4.500)

## 2023-05-17 ENCOUNTER — Other Ambulatory Visit: Payer: Medicare HMO

## 2023-05-17 ENCOUNTER — Ambulatory Visit (HOSPITAL_COMMUNITY)
Admission: RE | Admit: 2023-05-17 | Discharge: 2023-05-17 | Disposition: A | Discharge status: Home / Self Care | Payer: Medicare HMO | Source: Ambulatory Visit | Attending: Orthopedic Surgery | Admitting: Orthopedic Surgery

## 2023-05-17 DIAGNOSIS — R609 Edema, unspecified: Secondary | ICD-10-CM | POA: Diagnosis not present

## 2023-05-17 DIAGNOSIS — M545 Low back pain, unspecified: Secondary | ICD-10-CM | POA: Insufficient documentation

## 2023-05-17 DIAGNOSIS — M5410 Radiculopathy, site unspecified: Secondary | ICD-10-CM | POA: Diagnosis not present

## 2023-05-17 DIAGNOSIS — M4316 Spondylolisthesis, lumbar region: Secondary | ICD-10-CM | POA: Diagnosis not present

## 2023-05-17 DIAGNOSIS — M4727 Other spondylosis with radiculopathy, lumbosacral region: Secondary | ICD-10-CM | POA: Diagnosis not present

## 2023-05-17 DIAGNOSIS — M4726 Other spondylosis with radiculopathy, lumbar region: Secondary | ICD-10-CM | POA: Diagnosis not present

## 2023-05-24 ENCOUNTER — Other Ambulatory Visit: Payer: Self-pay | Admitting: Internal Medicine

## 2023-05-24 DIAGNOSIS — M545 Low back pain, unspecified: Secondary | ICD-10-CM

## 2023-06-13 ENCOUNTER — Encounter: Payer: Self-pay | Admitting: Physical Medicine and Rehabilitation

## 2023-06-13 ENCOUNTER — Ambulatory Visit (INDEPENDENT_AMBULATORY_CARE_PROVIDER_SITE_OTHER): Payer: Medicare HMO | Admitting: Physical Medicine and Rehabilitation

## 2023-06-13 DIAGNOSIS — M47816 Spondylosis without myelopathy or radiculopathy, lumbar region: Secondary | ICD-10-CM

## 2023-06-13 DIAGNOSIS — M5416 Radiculopathy, lumbar region: Secondary | ICD-10-CM

## 2023-06-13 DIAGNOSIS — G8929 Other chronic pain: Secondary | ICD-10-CM | POA: Diagnosis not present

## 2023-06-13 DIAGNOSIS — M5441 Lumbago with sciatica, right side: Secondary | ICD-10-CM | POA: Diagnosis not present

## 2023-06-13 DIAGNOSIS — M5116 Intervertebral disc disorders with radiculopathy, lumbar region: Secondary | ICD-10-CM
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
## 2023-06-13 NOTE — Progress Notes (Unsigned)
 Pain Score--1 Patient advising she has more pain when trying to lay down and sleep at night.

## 2023-06-20 ENCOUNTER — Telehealth: Payer: Self-pay | Admitting: Physical Medicine and Rehabilitation

## 2023-06-20 NOTE — Telephone Encounter (Signed)
 Pt called to request a phone call back in regards to rescheduling an appt

## 2023-06-28 ENCOUNTER — Encounter: Admitting: Physical Medicine and Rehabilitation

## 2023-06-29 ENCOUNTER — Other Ambulatory Visit: Payer: Self-pay

## 2023-06-29 ENCOUNTER — Ambulatory Visit: Admitting: Physical Medicine and Rehabilitation

## 2023-06-29 VITALS — BP 173/83 | HR 66

## 2023-06-29 DIAGNOSIS — M5416 Radiculopathy, lumbar region: Secondary | ICD-10-CM

## 2023-06-29 MED ORDER — METHYLPREDNISOLONE ACETATE 40 MG/ML IJ SUSP
40.0000 mg | Freq: Once | INTRAMUSCULAR | Status: AC
  Administered 2023-06-29: 40 mg

## 2023-06-29 NOTE — Progress Notes (Signed)
 Pain Scale   Average Pain 1        +Driver, -BT, -Dye Allergies.

## 2023-06-29 NOTE — Patient Instructions (Signed)

## 2023-07-12 NOTE — Procedures (Signed)
 Lumbar Epidural Steroid Injection - Interlaminar Approach with Fluoroscopic Guidance  Patient: Courtney Tapia      Date of Birth: July 05, 1950 MRN: [id number] PCP: Anabel Halon, MD      Visit Date: 06/29/2023   Universal Protocol:     Consent Given By: the patient  Position: PRONE  Additional Comments: Vital signs were monitored before and after the procedure. Patient was prepped and draped in the usual sterile fashion. The correct patient, procedure, and site was verified.   Injection Procedure Details:   Procedure diagnoses: Lumbar radiculopathy [M54.16]   Meds Administered:  Meds ordered this encounter  Medications   methylPREDNISolone acetate (DEPO-MEDROL) injection 40 mg     Laterality: Right  Location/Site:  L5-S1  Needle: 3.5 in., 20 ga. Tuohy  Needle Placement: Paramedian epidural  Findings:   -Comments: Excellent flow of contrast into the epidural space.  Procedure Details: Using a paramedian approach from the side mentioned above, the region overlying the inferior lamina was localized under fluoroscopic visualization and the soft tissues overlying this structure were infiltrated with 4 ml. of 1% Lidocaine without Epinephrine. The Tuohy needle was inserted into the epidural space using a paramedian approach.   The epidural space was localized using loss of resistance along with counter oblique bi-planar fluoroscopic views.  After negative aspirate for air, blood, and CSF, a 2 ml. volume of Isovue-250 was injected into the epidural space and the flow of contrast was observed. Radiographs were obtained for documentation purposes.    The injectate was administered into the level noted above.   Additional Comments:  No complications occurred Dressing: 2 x 2 sterile gauze and Band-Aid    Post-procedure details: Patient was observed during the procedure. Post-procedure instructions were reviewed.  Patient left the clinic in stable condition.

## 2023-07-12 NOTE — Progress Notes (Signed)
 Courtney Tapia - 73 y.o. female MRN [id number]  Date of birth: 07-15-1950  Office Visit Note: Visit Date: 06/29/2023 PCP: Anabel Halon, MD Referred by: Anabel Halon, MD  Subjective: Chief Complaint  Patient presents with   Lower Back - Pain   HPI:  Courtney Tapia is a 73 y.o. female who comes in today at the request of Ellin Goodie, FNP for planned Right L5-S1 Lumbar Interlaminar epidural steroid injection with fluoroscopic guidance.  The patient has failed conservative care including home exercise, medications, time and activity modification.  This injection will be diagnostic and hopefully therapeutic.  Please see requesting physician notes for further details and justification.   ROS Otherwise per HPI.  Assessment & Plan: Visit Diagnoses:    ICD-10-CM   1. Lumbar radiculopathy  M54.16 XR C-ARM NO REPORT    Epidural Steroid injection    methylPREDNISolone acetate (DEPO-MEDROL) injection 40 mg      Plan: No additional findings.   Meds & Orders:  Meds ordered this encounter  Medications   methylPREDNISolone acetate (DEPO-MEDROL) injection 40 mg    Orders Placed This Encounter  Procedures   XR C-ARM NO REPORT   Epidural Steroid injection    Follow-up: Return for visit to requesting provider as needed.   Procedures: No procedures performed  Lumbar Epidural Steroid Injection - Interlaminar Approach with Fluoroscopic Guidance  Patient: Courtney Tapia      Date of Birth: [birth year] MRN: [id number] PCP: Anabel Halon, MD      Visit Date: 06/29/2023   Universal Protocol:     Consent Given By: the patient  Position: PRONE  Additional Comments: Vital signs were monitored before and after the procedure. Patient was prepped and draped in the usual sterile fashion. The correct patient, procedure, and site was verified.   Injection Procedure Details:   Procedure diagnoses: Lumbar radiculopathy [M54.16]   Meds Administered:  Meds ordered this encounter   Medications   methylPREDNISolone acetate (DEPO-MEDROL) injection 40 mg     Laterality: Right  Location/Site:  L5-S1  Needle: 3.5 in., 20 ga. Tuohy  Needle Placement: Paramedian epidural  Findings:   -Comments: Excellent flow of contrast into the epidural space.  Procedure Details: Using a paramedian approach from the side mentioned above, the region overlying the inferior lamina was localized under fluoroscopic visualization and the soft tissues overlying this structure were infiltrated with 4 ml. of 1% Lidocaine without Epinephrine. The Tuohy needle was inserted into the epidural space using a paramedian approach.   The epidural space was localized using loss of resistance along with counter oblique bi-planar fluoroscopic views.  After negative aspirate for air, blood, and CSF, a 2 ml. volume of Isovue-250 was injected into the epidural space and the flow of contrast was observed. Radiographs were obtained for documentation purposes.    The injectate was administered into the level noted above.   Additional Comments:  No complications occurred Dressing: 2 x 2 sterile gauze and Band-Aid    Post-procedure details: Patient was observed during the procedure. Post-procedure instructions were reviewed.  Patient left the clinic in stable condition.   Clinical History: Narrative & Impression CLINICAL DATA:  Lumbar radiculopathy, symptoms persist with greater than 6 weeks of treatment.   EXAM: MRI LUMBAR SPINE WITHOUT CONTRAST   TECHNIQUE: Multiplanar, multisequence MR imaging of the lumbar spine was performed. No intravenous contrast was administered.   COMPARISON:  Radiography 04/17/2023.  MRI 09/25/2015.   FINDINGS: Segmentation: 5 lumbar type vertebral bodies  as numbered previously.   Alignment:  2 mm degenerative anterolisthesis L4-5 and L5-S1.   Vertebrae: Newly seen endplate marrow edema at the L1-2 and L5-S1 levels which could relate to regional pain. Worsened  edematous change of the facet joints at L4-5 and L5-S1 likely to be painful.   Conus medullaris and cauda equina: Conus extends to the L1-2 level. Conus and cauda equina appear normal.   Paraspinal and other soft tissues: Negative   Disc levels:   Mild non-compressive disc bulges from T11-12 through L3-4. No stenosis of the canal or foramina.   L4-5: Bilateral facet arthropathy with facet and ligamentous hypertrophy and edematous change. Degenerative anterolisthesis of 2 mm. Bulging of the disc. Mild multifactorial stenosis but without definite neural compression. The facet arthropathy could certainly relate to back pain or referred facet syndrome pain.   L5-S1: Bilateral facet arthropathy with facet and ligamentous hypertrophy and edematous change. Degenerative anterolisthesis of 2 mm. Shallow protrusion of the disc slightly more prominent towards the right. No central canal stenosis. Subarticular lateral recess narrowing on the right which could possibly affect the right S1 nerve. The facet arthropathy would likely be a cause of back pain or referred facet syndrome pain.   IMPRESSION: 1. L4-5: Bilateral facet arthropathy with facet and ligamentous hypertrophy and edematous change. Degenerative anterolisthesis of 2 mm. Bulging of the disc. Mild multifactorial stenosis but without definite neural compression. The facet arthropathy could certainly relate to back pain or referred facet syndrome pain. Findings have worsened since 2017. 2. L5-S1: Bilateral facet arthropathy with facet and ligamentous hypertrophy and edematous change. Degenerative anterolisthesis of 2 mm. Shallow protrusion of the disc slightly more prominent towards the right. Subarticular lateral recess narrowing on the right which could possibly affect the right S1 nerve. The facet arthropathy would likely be a cause of back pain or referred facet syndrome pain. Findings have worsened since 2017. 3. Newly seen  endplate marrow edema at the L1-2 and L5-S1 levels which could relate to regional pain.     Electronically Signed   By: Paulina Fusi M.D.   On: 05/27/2023 20:07     Objective:  VS:  HT:    WT:   BMI:     BP:(!) 173/83  HR:66bpm  TEMP: ( )  RESP:  Physical Exam Vitals and nursing note reviewed.  Constitutional:      General: She is not in acute distress.    Appearance: Normal appearance. She is not ill-appearing.  HENT:     Head: Normocephalic and atraumatic.     Right Ear: External ear normal.     Left Ear: External ear normal.  Eyes:     Extraocular Movements: Extraocular movements intact.  Cardiovascular:     Rate and Rhythm: Normal rate.     Pulses: Normal pulses.  Pulmonary:     Effort: Pulmonary effort is normal. No respiratory distress.  Abdominal:     General: There is no distension.     Palpations: Abdomen is soft.  Musculoskeletal:        General: Tenderness present.     Cervical back: Neck supple.     Right lower leg: No edema.     Left lower leg: No edema.     Comments: Patient has good distal strength with no pain over the greater trochanters.  No clonus or focal weakness.  Skin:    Findings: No erythema, lesion or rash.  Neurological:     General: No focal deficit present.  Mental Status: She is alert and oriented to person, place, and time.     Sensory: No sensory deficit.     Motor: No weakness or abnormal muscle tone.     Coordination: Coordination normal.  Psychiatric:        Mood and Affect: Mood normal.        Behavior: Behavior normal.      Imaging: No results found.

## 2023-07-18 ENCOUNTER — Other Ambulatory Visit (HOSPITAL_COMMUNITY): Payer: Self-pay | Admitting: Internal Medicine

## 2023-07-18 DIAGNOSIS — Z1231 Encounter for screening mammogram for malignant neoplasm of breast: Secondary | ICD-10-CM

## 2023-08-02 ENCOUNTER — Ambulatory Visit (HOSPITAL_COMMUNITY)
Admission: RE | Admit: 2023-08-02 | Discharge: 2023-08-02 | Disposition: A | Discharge status: Home / Self Care | Source: Ambulatory Visit | Attending: Internal Medicine | Admitting: Internal Medicine

## 2023-08-02 DIAGNOSIS — Z1231 Encounter for screening mammogram for malignant neoplasm of breast: Secondary | ICD-10-CM | POA: Diagnosis not present

## 2023-08-07 DIAGNOSIS — H2513 Age-related nuclear cataract, bilateral: Secondary | ICD-10-CM | POA: Diagnosis not present

## 2023-08-07 DIAGNOSIS — H43813 Vitreous degeneration, bilateral: Secondary | ICD-10-CM | POA: Diagnosis not present

## 2023-08-07 DIAGNOSIS — H11042 Peripheral pterygium, stationary, left eye: Secondary | ICD-10-CM | POA: Diagnosis not present

## 2023-08-18 ENCOUNTER — Other Ambulatory Visit: Payer: Self-pay | Admitting: Internal Medicine

## 2023-08-18 DIAGNOSIS — F419 Anxiety disorder, unspecified: Secondary | ICD-10-CM

## 2023-09-13 ENCOUNTER — Ambulatory Visit: Payer: Medicare HMO | Admitting: Internal Medicine

## 2023-09-20 ENCOUNTER — Telehealth: Payer: Self-pay | Admitting: Physical Medicine and Rehabilitation

## 2023-09-20 ENCOUNTER — Other Ambulatory Visit: Payer: Self-pay | Admitting: Physical Medicine and Rehabilitation

## 2023-09-20 DIAGNOSIS — M5416 Radiculopathy, lumbar region: Secondary | ICD-10-CM

## 2023-09-20 NOTE — Telephone Encounter (Signed)
 Pt request an appointment for an injection.  Please try both numbers listed in chart, call home number first then cell.

## 2023-10-10 ENCOUNTER — Telehealth: Payer: Self-pay | Admitting: Physical Medicine and Rehabilitation

## 2023-10-10 NOTE — Telephone Encounter (Signed)
 Pt states she missed a call from [facility] number and no message was left. Pt was checking to see if Dr Lyda team had called her. No messages in chart

## 2023-10-18 ENCOUNTER — Other Ambulatory Visit: Payer: Self-pay | Admitting: Internal Medicine

## 2023-10-18 DIAGNOSIS — K2270 Barrett's esophagus without dysplasia: Secondary | ICD-10-CM

## 2023-10-31 ENCOUNTER — Ambulatory Visit (INDEPENDENT_AMBULATORY_CARE_PROVIDER_SITE_OTHER): Admitting: Internal Medicine

## 2023-10-31 ENCOUNTER — Encounter: Payer: Self-pay | Admitting: Internal Medicine

## 2023-10-31 VITALS — BP 132/72 | HR 60 | Ht 66.0 in | Wt 149.8 lb

## 2023-10-31 DIAGNOSIS — E782 Mixed hyperlipidemia: Secondary | ICD-10-CM | POA: Diagnosis not present

## 2023-10-31 DIAGNOSIS — R7303 Prediabetes: Secondary | ICD-10-CM | POA: Diagnosis not present

## 2023-10-31 DIAGNOSIS — F419 Anxiety disorder, unspecified: Secondary | ICD-10-CM

## 2023-10-31 DIAGNOSIS — E038 Other specified hypothyroidism: Secondary | ICD-10-CM | POA: Diagnosis not present

## 2023-10-31 DIAGNOSIS — K2270 Barrett's esophagus without dysplasia: Secondary | ICD-10-CM

## 2023-10-31 DIAGNOSIS — I10 Essential (primary) hypertension: Secondary | ICD-10-CM | POA: Diagnosis not present

## 2023-10-31 DIAGNOSIS — M47816 Spondylosis without myelopathy or radiculopathy, lumbar region: Secondary | ICD-10-CM

## 2023-10-31 DIAGNOSIS — E559 Vitamin D deficiency, unspecified: Secondary | ICD-10-CM

## 2023-10-31 DIAGNOSIS — M810 Age-related osteoporosis without current pathological fracture: Secondary | ICD-10-CM

## 2023-10-31 MED ORDER — LOSARTAN POTASSIUM 50 MG PO TABS: 75.0000 mg | ORAL_TABLET | Freq: Every day | ORAL | 1 refills | Status: DC

## 2023-10-31 NOTE — Assessment & Plan Note (Addendum)
 Had started Alendronate , but could not start due to recent dental work-up at that time - advised to start taking it now Continue Caltrate 600 + D3 twice daily

## 2023-10-31 NOTE — Assessment & Plan Note (Addendum)
 Recent x-ray and old MRI of lumbar spine reviewed -lumbar spondylosis with facet hypertrophy at L3-4 and foraminal narrowing at L4-5 Her LE pain is likely radicular pain from lumbar spondylosis Tylenol arthritis PRN for pain Voltaren  gel PRN Avoid heavy lifting and frequent bending Followed by orthopedic surgeon and PM&R - planned to get repeat epidural steroid injection

## 2023-10-31 NOTE — Assessment & Plan Note (Signed)
Reviewed last lipid profile Continue to follow low cholesterol diet

## 2023-10-31 NOTE — Assessment & Plan Note (Addendum)
 Takes Xanax  0.5 - 1 mg QD PRN, PDMP reviewed, refilled Worsening of anxiety or panic attack can also cause elevated high blood pressure - advised to take Xanax  1 mg nightly as needed

## 2023-10-31 NOTE — Patient Instructions (Signed)
 Please continue to take medications as prescribed.  Please continue to follow low salt diet and perform moderate exercise/walking at least 150 mins/week.  Please get fasting blood tests done before the next visit.

## 2023-10-31 NOTE — Assessment & Plan Note (Signed)
 Lab Results  Component Value Date   TSH 2.120 05/10/2023   On Levothyroxine  50 mcg QD now Check TSH and free T4

## 2023-10-31 NOTE — Assessment & Plan Note (Signed)
 BP Readings from Last 1 Encounters:  10/31/23 132/72   Well-controlled with losartan  75 mg once daily now Advised to keep log of home BP readings and bring in the next visit Counseled for compliance with the medications Advised DASH diet and moderate exercise/walking, at least 150 mins/week
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
## 2023-10-31 NOTE — Assessment & Plan Note (Signed)
Follow up with Dr Fuller Plan On Omeprazole

## 2023-11-01 ENCOUNTER — Ambulatory Visit: Admitting: Physical Medicine and Rehabilitation

## 2023-11-01 ENCOUNTER — Other Ambulatory Visit: Payer: Self-pay

## 2023-11-01 VITALS — BP 156/81 | HR 59

## 2023-11-01 DIAGNOSIS — M47816 Spondylosis without myelopathy or radiculopathy, lumbar region: Secondary | ICD-10-CM

## 2023-11-01 DIAGNOSIS — M4316 Spondylolisthesis, lumbar region: Secondary | ICD-10-CM | POA: Diagnosis not present

## 2023-11-01 DIAGNOSIS — M5416 Radiculopathy, lumbar region: Secondary | ICD-10-CM | POA: Diagnosis not present

## 2023-11-01 DIAGNOSIS — M7061 Trochanteric bursitis, right hip: Secondary | ICD-10-CM | POA: Diagnosis not present

## 2023-11-01 MED ORDER — METHYLPREDNISOLONE ACETATE 40 MG/ML IJ SUSP
40.0000 mg | Freq: Once | INTRAMUSCULAR | Status: AC
  Administered 2023-11-01: 40 mg

## 2023-11-01 NOTE — Progress Notes (Signed)
 Pain Scale   Average Pain 8 in AM  Patient advising she has lower back pain that radiates to her right leg.usually in AM and PM and during the day walking and moving eases the pain.        +Driver, -BT, -Dye Allergies.

## 2023-11-01 NOTE — Patient Instructions (Addendum)

## 2023-11-01 NOTE — Progress Notes (Addendum)
 Courtney Tapia - 73 y.o. female MRN [id number]  Date of birth: [birth year]  Office Visit Note: Visit Date: 11/01/2023 PCP: Tobie Suzzane POUR, MD Referred by: Tobie Suzzane POUR, MD  Subjective: Chief Complaint  Patient presents with   Lower Back - Pain   HPI:  Courtney Tapia is a 73 y.o. female who comes in today for re-evaluation of her right hip and leg pain.  We actually had her coming in today for possible epidural injection repeat.  We did complete a right L5-S1 interlaminar epidural steroid injection a few months ago.  We always talk to the patient about calling us  back if in 2 weeks or so it just did not seem to help.  She reports that she thought it helped but more I question her it was just really a very small relief of any.  Typically the epidurals with the help for nerve pain is usually a big relief.  I did review the images from the injection looks well-placed.  Again from a lumbar spine standpoint she has arthritis at L4-5 and L5-S1 bilaterally with small degenerative listhesis and mild narrowing and no high-grade nerve compression or stenosis.  She does have some back pain but is mainly her hip and thigh.  This seems to worsen with laying on that side and with walking.  It does go into the greater trochanter on the right and some referral to mid thigh.  Nothing really past the knee.  Not much in the way of paresthesia.  Nothing really on the left side.  No focal weakness no new trauma.  She does get pain with ambulation.   Review of Systems  Musculoskeletal:  Positive for joint pain.  All other systems reviewed and are negative.  Otherwise per HPI.  Assessment & Plan: Visit Diagnoses:    ICD-10-CM   1. Greater trochanteric bursitis, right  M70.61 XR C-ARM NO REPORT    methylPREDNISolone  acetate (DEPO-MEDROL ) injection 40 mg    Large Joint Inj: R greater trochanter    bupivacaine  (MARCAINE ) 0.25 % (with pres) injection 4 mL    lidocaine  (XYLOCAINE ) 2 % (with pres) injection 4 mL     triamcinolone  acetonide (KENALOG -40) injection 40 mg    CANCELED: Epidural Steroid injection    2. Lumbar radiculopathy  M54.16     3. Spondylosis without myelopathy or radiculopathy, lumbar region  M47.816     4. Spondylolisthesis of lumbar region  M43.16       Plan: Findings:  At this point still seems like it could be radiculopathy radiculitis but without much diagnostic relief on epidural injection.  I think the next approach is a diagnostic greater trochanteric injection with for scopic guidance which we can do today.  Depending on the relief with that however we would have to look at maybe a transforaminal approach to the lumbar spine versus looking at this as a facet joint syndrome.  She will continue with current medications and home exercise program.  We did complete the trochanteric injection today.    Meds & Orders:  Meds ordered this encounter  Medications   methylPREDNISolone  acetate (DEPO-MEDROL ) injection 40 mg   bupivacaine  (MARCAINE ) 0.25 % (with pres) injection 4 mL   lidocaine  (XYLOCAINE ) 2 % (with pres) injection 4 mL   triamcinolone  acetonide (KENALOG -40) injection 40 mg    Orders Placed This Encounter  Procedures   Large Joint Inj: R greater trochanter   XR C-ARM NO REPORT    Follow-up: Return if symptoms  worsen or fail to improve.   Procedures: Large Joint Inj: R greater trochanter on 11/01/2023 2:00 PM Indications: pain and diagnostic evaluation Details: 22 G 3.5 in needle, fluoroscopy-guided lateral approach  Arthrogram: No  Medications: 4 mL lidocaine  2 %; 4 mL bupivacaine  0.25 %; 40 mg triamcinolone  acetonide 40 MG/ML Outcome: tolerated well, no immediate complications  There was excellent flow of contrast outlined the greater trochanteric bursa without vascular uptake. Procedure, treatment alternatives, risks and benefits explained, specific risks discussed. Consent was given by the patient. Immediately prior to procedure a time out was called to  verify the correct patient, procedure, equipment, support staff and site/side marked as required. Patient was prepped and draped in the usual sterile fashion.          Clinical History: Narrative & Impression CLINICAL DATA:  Lumbar radiculopathy, symptoms persist with greater than 6 weeks of treatment.   EXAM: MRI LUMBAR SPINE WITHOUT CONTRAST   TECHNIQUE: Multiplanar, multisequence MR imaging of the lumbar spine was performed. No intravenous contrast was administered.   COMPARISON:  Radiography 04/17/2023.  MRI 09/25/2015.   FINDINGS: Segmentation: 5 lumbar type vertebral bodies as numbered previously.   Alignment:  2 mm degenerative anterolisthesis L4-5 and L5-S1.   Vertebrae: Newly seen endplate marrow edema at the L1-2 and L5-S1 levels which could relate to regional pain. Worsened edematous change of the facet joints at L4-5 and L5-S1 likely to be painful.   Conus medullaris and cauda equina: Conus extends to the L1-2 level. Conus and cauda equina appear normal.   Paraspinal and other soft tissues: Negative   Disc levels:   Mild non-compressive disc bulges from T11-12 through L3-4. No stenosis of the canal or foramina.   L4-5: Bilateral facet arthropathy with facet and ligamentous hypertrophy and edematous change. Degenerative anterolisthesis of 2 mm. Bulging of the disc. Mild multifactorial stenosis but without definite neural compression. The facet arthropathy could certainly relate to back pain or referred facet syndrome pain.   L5-S1: Bilateral facet arthropathy with facet and ligamentous hypertrophy and edematous change. Degenerative anterolisthesis of 2 mm. Shallow protrusion of the disc slightly more prominent towards the right. No central canal stenosis. Subarticular lateral recess narrowing on the right which could possibly affect the right S1 nerve. The facet arthropathy would likely be a cause of back pain or referred facet syndrome pain.    IMPRESSION: 1. L4-5: Bilateral facet arthropathy with facet and ligamentous hypertrophy and edematous change. Degenerative anterolisthesis of 2 mm. Bulging of the disc. Mild multifactorial stenosis but without definite neural compression. The facet arthropathy could certainly relate to back pain or referred facet syndrome pain. Findings have worsened since 2017. 2. L5-S1: Bilateral facet arthropathy with facet and ligamentous hypertrophy and edematous change. Degenerative anterolisthesis of 2 mm. Shallow protrusion of the disc slightly more prominent towards the right. Subarticular lateral recess narrowing on the right which could possibly affect the right S1 nerve. The facet arthropathy would likely be a cause of back pain or referred facet syndrome pain. Findings have worsened since 2017. 3. Newly seen endplate marrow edema at the L1-2 and L5-S1 levels which could relate to regional pain.     Electronically Signed   By: Oneil Officer M.D.   On: 05/27/2023 20:07     Objective:  VS:  HT:    WT:   BMI:     BP:(!) 156/81  HR:(!) 59bpm  TEMP: ( )  RESP:  Physical Exam Vitals and nursing note reviewed.  Constitutional:  General: She is not in acute distress.    Appearance: Normal appearance. She is not ill-appearing.  HENT:     Head: Normocephalic and atraumatic.     Right Ear: External ear normal.     Left Ear: External ear normal.  Eyes:     Extraocular Movements: Extraocular movements intact.  Cardiovascular:     Rate and Rhythm: Normal rate.     Pulses: Normal pulses.  Pulmonary:     Effort: Pulmonary effort is normal. No respiratory distress.  Abdominal:     General: There is no distension.     Palpations: Abdomen is soft.  Musculoskeletal:        General: Tenderness present.     Cervical back: Neck supple.     Right lower leg: No edema.     Left lower leg: No edema.     Comments: Patient has good distal strength with no pain over the greater trochanters.   No clonus or focal weakness.  Skin:    Findings: No erythema, lesion or rash.  Neurological:     General: No focal deficit present.     Mental Status: She is alert and oriented to person, place, and time.     Sensory: No sensory deficit.     Motor: No weakness or abnormal muscle tone.     Coordination: Coordination normal.  Psychiatric:        Mood and Affect: Mood normal.        Behavior: Behavior normal.      Imaging: No results found.

## 2023-11-08 ENCOUNTER — Telehealth: Payer: Self-pay | Admitting: Physical Medicine and Rehabilitation

## 2023-11-08 NOTE — Telephone Encounter (Signed)
 Patient called and said she is doing good with the shot. Just letting you know. CB#[phone]

## 2023-11-16 ENCOUNTER — Encounter: Payer: Self-pay | Admitting: Physical Medicine and Rehabilitation

## 2023-11-16 MED ORDER — TRIAMCINOLONE ACETONIDE 40 MG/ML IJ SUSP
40.0000 mg | INTRAMUSCULAR | Status: AC | PRN
  Administered 2023-11-01: 40 mg via INTRA_ARTICULAR

## 2023-11-16 MED ORDER — LIDOCAINE HCL 2 % IJ SOLN
4.0000 mL | INTRAMUSCULAR | Status: AC | PRN
  Administered 2023-11-01: 4 mL

## 2023-11-16 MED ORDER — BUPIVACAINE HCL 0.25 % IJ SOLN
4.0000 mL | INTRAMUSCULAR | Status: AC | PRN
  Administered 2023-11-01: 4 mL via INTRA_ARTICULAR

## 2023-11-24 ENCOUNTER — Ambulatory Visit: Admitting: Physical Medicine and Rehabilitation

## 2023-11-24 ENCOUNTER — Other Ambulatory Visit: Payer: Self-pay | Admitting: Internal Medicine

## 2023-11-24 DIAGNOSIS — E038 Other specified hypothyroidism: Secondary | ICD-10-CM

## 2023-12-27 ENCOUNTER — Other Ambulatory Visit: Payer: Self-pay | Admitting: Internal Medicine

## 2023-12-27 DIAGNOSIS — F419 Anxiety disorder, unspecified: Secondary | ICD-10-CM

## 2024-01-03 IMAGING — MG MM DIGITAL SCREENING BILAT W/ TOMO AND CAD
6 of 12 series · 6 of 36 positions shown · non-contrast
Comparison: Previous exam(s).

CLINICAL DATA: Screening.

EXAM:
DIGITAL SCREENING BILATERAL MAMMOGRAM WITH TOMOSYNTHESIS AND CAD
TECHNIQUE: Bilateral screening digital craniocaudal and mediolateral oblique
mammograms were obtained. Bilateral screening digital breast
tomosynthesis was performed. The images were evaluated with
computer-aided detection.

[R CC synth-2D (1 of 2)]
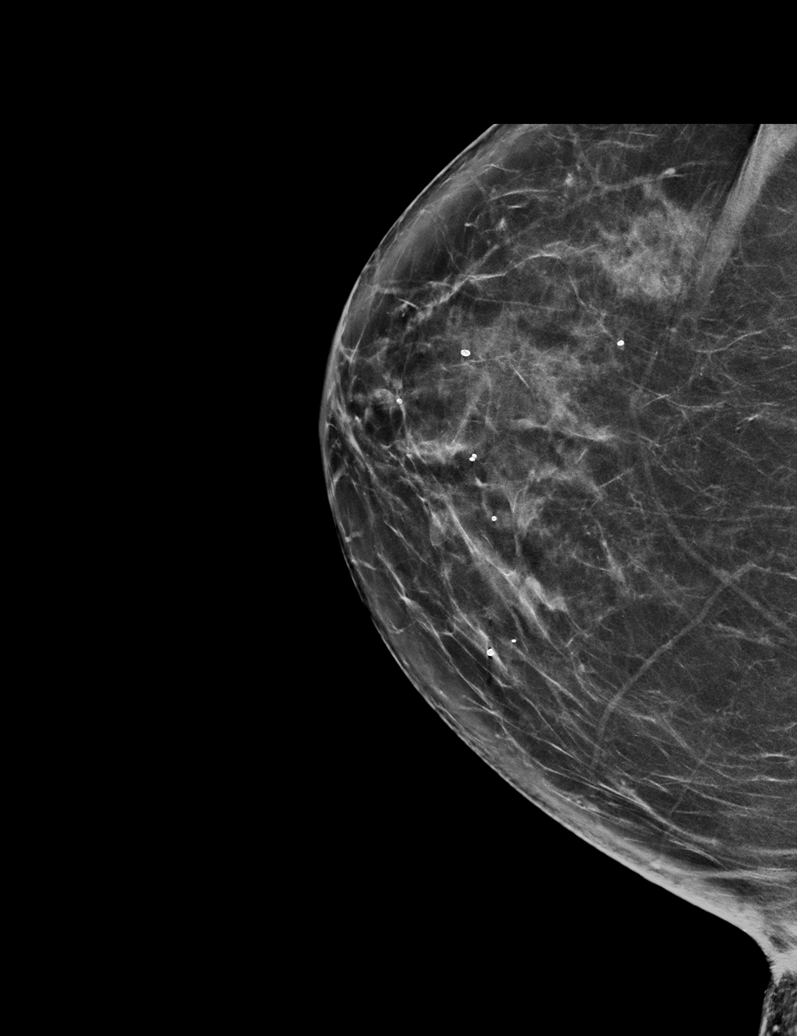

[L CC synth-2D (1 of 2)]
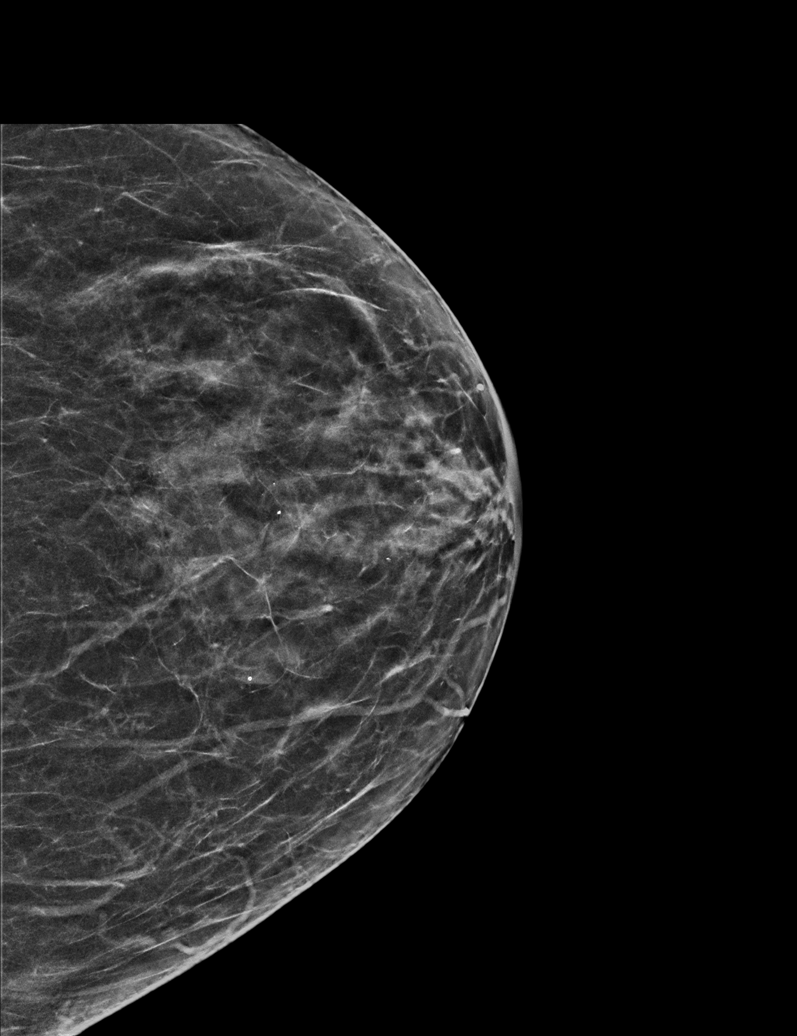

[R CC synth-2D (2 of 2)]
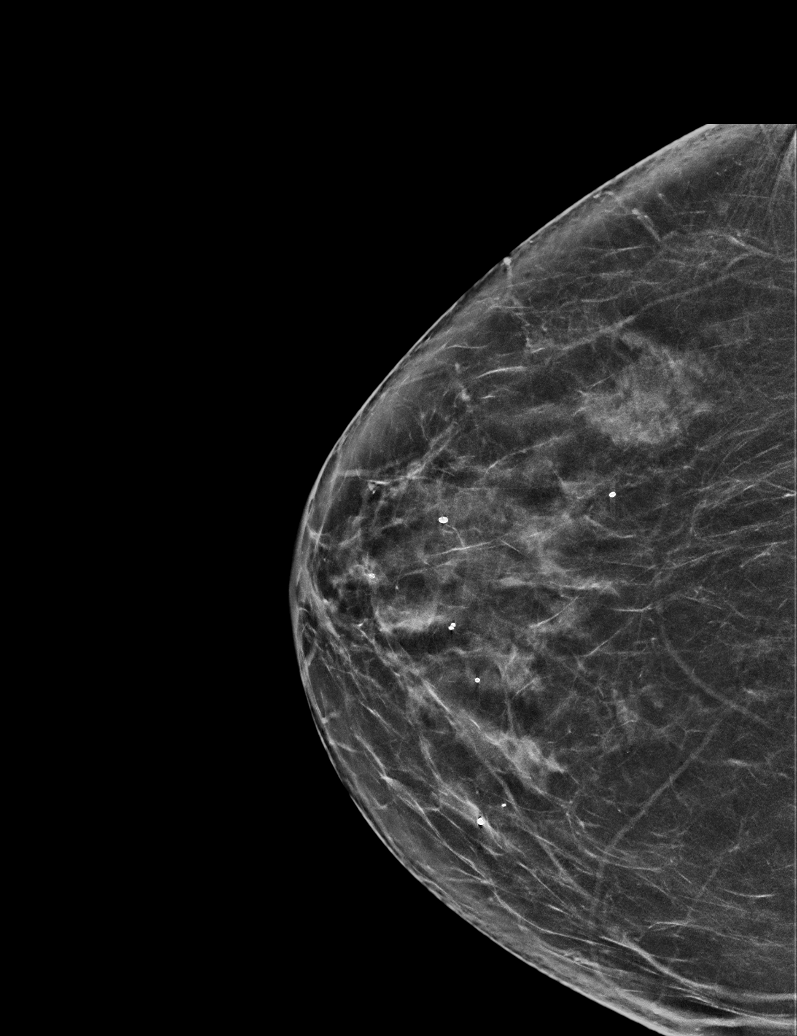

[L MLO synth-2D]
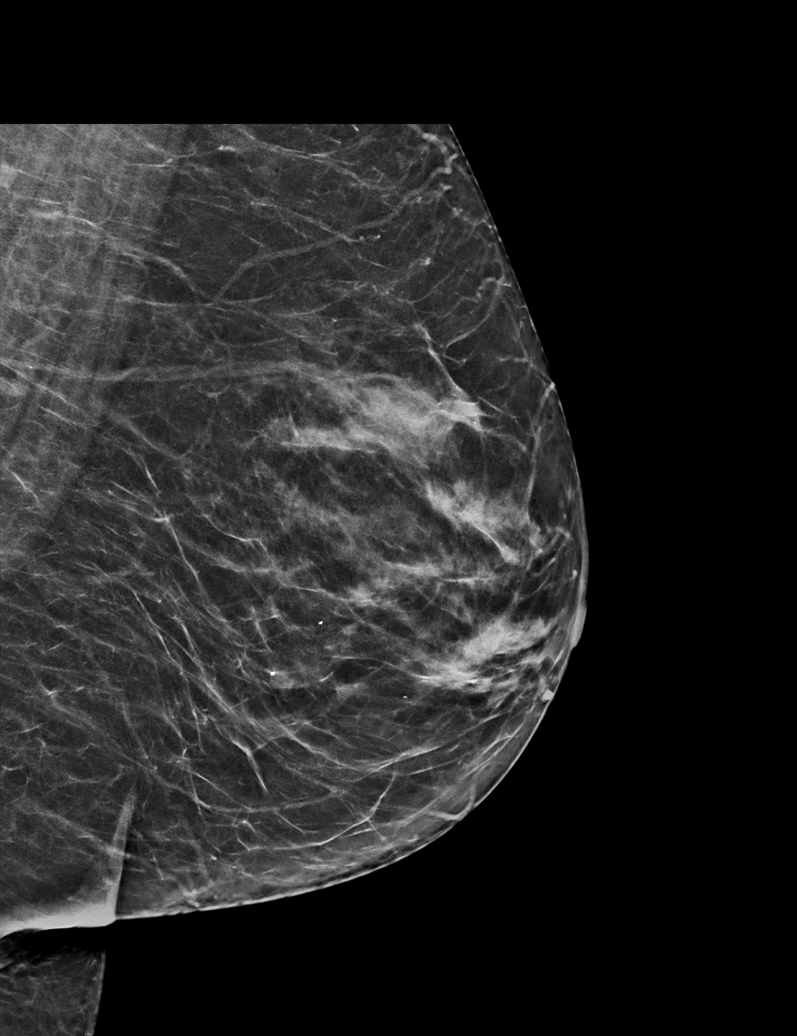

[L CC synth-2D (2 of 2)]
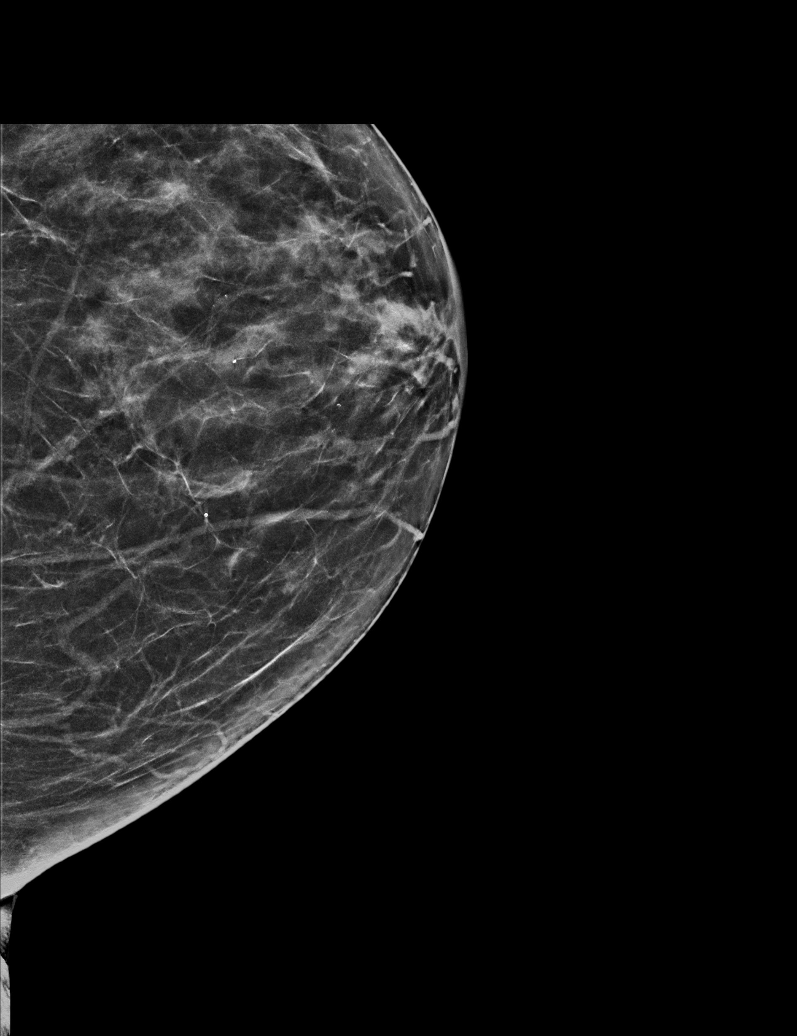

[R MLO synth-2D]
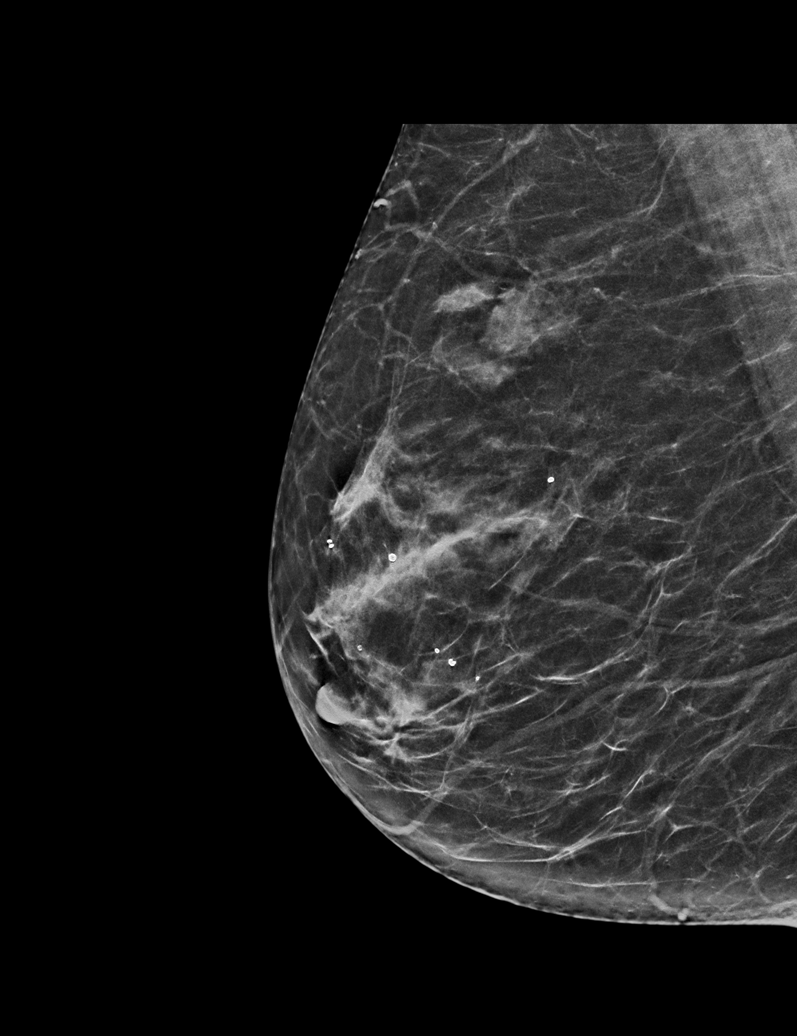

[6 of 36 positions shown; findings below may reference images not displayed]

ACR Breast Density Category b: There are scattered areas of
fibroglandular density.
FINDINGS: There are no findings suspicious for malignancy.
IMPRESSION: No mammographic evidence of malignancy. A result letter of this
screening mammogram will be mailed directly to the patient.

RECOMMENDATION:
Screening mammogram in one year. (Code:51-O-LD2)

BI-RADS CATEGORY  1: Negative.

## 2024-01-23 ENCOUNTER — Ambulatory Visit (INDEPENDENT_AMBULATORY_CARE_PROVIDER_SITE_OTHER)

## 2024-01-23 VITALS — BP 129/71 | Ht 66.0 in | Wt 153.0 lb

## 2024-01-23 DIAGNOSIS — Z0000 Encounter for general adult medical examination without abnormal findings: Secondary | ICD-10-CM
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
## 2024-01-23 NOTE — Patient Instructions (Signed)
 Courtney Tapia,  Thank you for taking the time for your Medicare Wellness Visit. I appreciate your continued commitment to your health goals. Please review the care plan we discussed, and feel free to reach out if I can assist you further.  Medicare recommends these wellness visits once per year to help you and your care team stay ahead of potential health issues. These visits are designed to focus on prevention, allowing your provider to concentrate on managing your acute and chronic conditions during your regular appointments.  Please note that Annual Wellness Visits do not include a physical exam. Some assessments may be limited, especially if the visit was conducted virtually. If needed, we may recommend a separate in-person follow-up with your provider.  Wishing you excellent health and many blessings in the year to come!  -Anahid Eskelson, CMA  Ongoing Care Seeing your primary care provider every 3 to 6 months helps us  monitor your health and provide consistent, personalized care.   Recommended Screenings:  Health Maintenance  Topic Date Due   Flu Shot  11/17/2023   COVID-19 Vaccine (4 - 2025-26 season) 12/18/2023   Breast Cancer Screening  08/01/2024   Colon Cancer Screening  08/12/2024   DEXA scan (bone density measurement)  12/13/2024   Medicare Annual Wellness Visit  01/22/2025   DTaP/Tdap/Td vaccine (2 - Td or Tdap) 11/11/2031   Pneumococcal Vaccine for age over 38  Completed   Hepatitis C Screening  Completed   Zoster (Shingles) Vaccine  Completed   Meningitis B Vaccine  Aged Out       01/23/2024    8:42 AM  Advanced Directives  Does Patient Have a Medical Advance Directive? Yes  Type of Estate agent of [place];Living will  Does patient want to make changes to medical advance directive? No - Patient declined  Copy of Healthcare Power of Attorney in Chart? Yes - validated most recent copy scanned in chart (See row information)   Advance Care Planning is  important because it: Ensures you receive medical care that aligns with your values, goals, and preferences. Provides guidance to your family and loved ones, reducing the emotional burden of decision-making during critical moments.  Vision: Annual vision screenings are recommended for early detection of glaucoma, cataracts, and diabetic retinopathy. These exams can also reveal signs of chronic conditions such as diabetes and high blood pressure.  Dental: Annual dental screenings help detect early signs of oral cancer, gum disease, and other conditions linked to overall health, including heart disease and diabetes.  Please see the attached documents for additional preventive care recommendations.

## 2024-01-25 NOTE — Progress Notes (Signed)
 Courtney Tapia                                          MRN: [id number]   01/25/2024   The [facility] Quality Team Specialist reviewed this patient medical record for the purposes of chart review for care gap closure. The following were reviewed: abstraction for care gap closure-controlling blood pressure.    [facility] Quality Team

## 2024-01-26 ENCOUNTER — Ambulatory Visit: Payer: Self-pay

## 2024-01-26 NOTE — Telephone Encounter (Signed)
 Patient returned call to this RN and was inquiring about dropping off a urine sample to office due to symptoms discussed during an earlier triage. This RN called [facility] and they confirmed that she can drop off urine sample today before 4:45 pm. This information relayed to patient and she states he is on her way to the office.

## 2024-01-26 NOTE — Telephone Encounter (Signed)
 FYI Only or Action Required?: FYI only for provider.  Patient was last seen in primary care on 10/31/2023 by Tobie Suzzane POUR, MD.  Called Nurse Triage reporting Urinary Frequency.  Symptoms began today.  Interventions attempted: Nothing.  Symptoms are: gradually worsening.  Triage Disposition: See Physician Within 24 Hours  Patient/caregiver understands and will follow disposition?: Yes  Copied from CRM [id number]. Topic: Clinical - Red Word Triage >> Jan 26, 2024  3:12 PM Charlet HERO wrote: Red Word that prompted transfer to Nurse Triage: Patent has burning pain and chills when she pee's she is stting that she thinks that she has uti. Patel Reason for Disposition  Urinating more frequently than usual (i.e., frequency) OR new-onset of the feeling of an urgent need to urinate (i.e., urgency)  Answer Assessment - Initial Assessment Questions Pt calls and states she's having burning when urinating and increase in frequency. Believes she has a UTI. Denies fevers/flank pain. Has had this before, prescribed cipro  and it resolved. UC recommendation at this time, pt agreeable.  1. SYMPTOM: What's the main symptom you're concerned about? (e.g., frequency, incontinence)     Burning; frequency 2. ONSET: When did the  above  start?     This afternoon 3. PAIN: Is there any pain? If Yes, ask: How bad is it? (Scale: 1-10; mild, moderate, severe)     7/10 4. CAUSE: What do you think is causing the symptoms?     UTI 5. OTHER SYMPTOMS: Do you have any other symptoms? (e.g., blood in urine, fever, flank pain, pain with urination)     denies  Protocols used: Urinary Symptoms-A-AH

## 2024-01-26 NOTE — Telephone Encounter (Signed)
 Front informed patient she can drop off urine, did not get approval from provider, no provider here to read results and send in medication , advised the front to inform patient and that she needed to go to urgent care

## 2024-01-29 ENCOUNTER — Ambulatory Visit: Payer: Self-pay | Admitting: *Deleted

## 2024-01-29 NOTE — Telephone Encounter (Signed)
 FYI Only or Action Required?: FYI only for provider.  Patient was last seen in primary care on 10/31/2023 by Tobie Suzzane POUR, MD.  Called Nurse Triage reporting Dysuria.  Symptoms began several days ago.  Interventions attempted: Prescription medications: patient took old Rx antibiotic over the weekend.  Symptoms are: unchanged.  Triage Disposition: See Physician Within 24 Hours  Patient/caregiver understands and will follow disposition?: Yes   Reason for Disposition  Age > 50 years  Answer Assessment - Initial Assessment Questions 1. SEVERITY: How bad is the pain?  (e.g., Scale 1-10; mild, moderate, or severe)     4/10  3. PATTERN: Is pain present every time you urinate or just sometimes?      Every time 4. ONSET: When did the painful urination start?      Last Tuesday 5. FEVER: Do you have a fever? If Yes, ask: What is your temperature, how was it measured, and when did it start?     no 6. PAST UTI: Have you had a urine infection before? If Yes, ask: When was the last time? and What happened that time?      Yes- 2 years ago 7. CAUSE: What do you think is causing the painful urination?  (e.g., UTI, scratch, Herpes sore)     UTI 8. OTHER SYMPTOMS: Do you have any other symptoms? (e.g., blood in urine, flank pain, genital sores, urgency, vaginal discharge)     Murky, foamy urine  Protocols used: Urination Pain - Female-A-AH Copied from CRM [id number]. Topic: Clinical - Red Word Triage >> Jan 29, 2024  1:49 PM Harlene ORN wrote: Red Word that prompted transfer to Nurse Triage: UTI since Friday last week. In pain when urinating

## 2024-01-29 NOTE — Telephone Encounter (Signed)
 Appt made.

## 2024-01-30 ENCOUNTER — Ambulatory Visit: Admitting: Family Medicine

## 2024-01-30 ENCOUNTER — Telehealth: Payer: Self-pay

## 2024-01-30 ENCOUNTER — Encounter: Payer: Self-pay | Admitting: Family Medicine

## 2024-01-30 VITALS — BP 150/82 | HR 63 | Temp 97.6°F | Ht 66.0 in | Wt 156.0 lb

## 2024-01-30 DIAGNOSIS — R300 Dysuria: Secondary | ICD-10-CM

## 2024-01-30 MED ORDER — CEPHALEXIN 500 MG PO CAPS: 500.0000 mg | ORAL_CAPSULE | Freq: Three times a day (TID) | ORAL | 0 refills | Status: DC
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
## 2024-01-30 NOTE — Telephone Encounter (Signed)
 Copied from CRM #[id number]. Topic: General - Other >> Jan 30, 2024  4:07 PM Lonell PEDLAR wrote: Reason for CRM: Patient is returning Dr. Anthony call.

## 2024-02-06 ENCOUNTER — Telehealth: Payer: Self-pay

## 2024-02-06 ENCOUNTER — Telehealth: Payer: Self-pay | Admitting: Physical Medicine and Rehabilitation

## 2024-02-06 DIAGNOSIS — M7061 Trochanteric bursitis, right hip: Secondary | ICD-10-CM

## 2024-02-06 NOTE — Addendum Note (Signed)
 Addended by: ELDONNA GARDINER POUR on: 02/06/2024 04:03 PM   Modules accepted: Orders

## 2024-02-06 NOTE — Telephone Encounter (Signed)
 10/2023 Right hip % 100 relief/function ability Duration of Relief/improvement--2 months Current pain score---4 No Recent falls or injuries Same location and same pain as last time

## 2024-02-06 NOTE — Telephone Encounter (Signed)
 Pt called stating that they want to make an appt for the same type of injection she received on 7/16. Pt call back number is [phone]

## 2024-02-09 ENCOUNTER — Other Ambulatory Visit: Payer: Self-pay

## 2024-02-09 ENCOUNTER — Ambulatory Visit: Admitting: Physical Medicine and Rehabilitation

## 2024-02-09 DIAGNOSIS — M7061 Trochanteric bursitis, right hip: Secondary | ICD-10-CM

## 2024-02-09 MED ORDER — BUPIVACAINE HCL 0.25 % IJ SOLN
4.0000 mL | INTRAMUSCULAR | Status: AC | PRN
  Administered 2024-02-09: 4 mL via INTRA_ARTICULAR

## 2024-02-09 MED ORDER — LIDOCAINE HCL 2 % IJ SOLN
4.0000 mL | INTRAMUSCULAR | Status: AC | PRN
  Administered 2024-02-09: 4 mL

## 2024-02-09 MED ORDER — TRIAMCINOLONE ACETONIDE 40 MG/ML IJ SUSP
40.0000 mg | INTRAMUSCULAR | Status: AC | PRN
  Administered 2024-02-09: 40 mg via INTRA_ARTICULAR

## 2024-02-09 NOTE — Progress Notes (Signed)
 Pain Scale   Average Pain 3 Patient advising she has chronic right hip pain pain is constant.        +Driver, -BT, -Dye Allergies.

## 2024-02-09 NOTE — Progress Notes (Signed)
 Courtney Tapia - 73 y.o. female MRN [id number]  Date of birth: May 18, 1950  Office Visit Note: Visit Date: 02/09/2024 PCP: Tobie Suzzane POUR, MD Referred by: Tobie Suzzane POUR, MD  Subjective: Chief Complaint  Patient presents with   Right Hip - Pain   HPI:  Courtney Tapia is a 73 y.o. female who comes in today at the request of Duwaine Pouch, FNP for planned Right greater trochanteric injection with fluoroscopic guidance.  The patient has failed conservative care including home exercise, medications, time and activity modification.  This injection will be diagnostic and hopefully therapeutic.  Please see requesting physician notes for further details and justification.    ROS Otherwise per HPI.  Assessment & Plan: Visit Diagnoses:    ICD-10-CM   1. Greater trochanteric bursitis, right  M70.61 XR C-ARM NO REPORT      Plan: No additional findings.   Meds & Orders: No orders of the defined types were placed in this encounter.   Orders Placed This Encounter  Procedures   Large Joint Inj   XR C-ARM NO REPORT    Follow-up: Return for visit to requesting provider as needed.   Procedures: Large Joint Inj: R greater trochanter on 02/09/2024 8:32 AM Indications: pain and diagnostic evaluation Details: 22 G 3.5 in needle, fluoroscopy-guided lateral approach  Arthrogram: No  Medications: 4 mL lidocaine  2 %; 4 mL bupivacaine  0.25 %; 40 mg triamcinolone  acetonide 40 MG/ML Outcome: tolerated well, no immediate complications  There was excellent flow of contrast outlined the greater trochanteric bursa without vascular uptake. Procedure, treatment alternatives, risks and benefits explained, specific risks discussed. Consent was given by the patient. Immediately prior to procedure a time out was called to verify the correct patient, procedure, equipment, support staff and site/side marked as required. Patient was prepped and draped in the usual sterile fashion.          Clinical  History: Narrative & Impression CLINICAL DATA:  Lumbar radiculopathy, symptoms persist with greater than 6 weeks of treatment.   EXAM: MRI LUMBAR SPINE WITHOUT CONTRAST   TECHNIQUE: Multiplanar, multisequence MR imaging of the lumbar spine was performed. No intravenous contrast was administered.   COMPARISON:  Radiography 04/17/2023.  MRI 09/25/2015.   FINDINGS: Segmentation: 5 lumbar type vertebral bodies as numbered previously.   Alignment:  2 mm degenerative anterolisthesis L4-5 and L5-S1.   Vertebrae: Newly seen endplate marrow edema at the L1-2 and L5-S1 levels which could relate to regional pain. Worsened edematous change of the facet joints at L4-5 and L5-S1 likely to be painful.   Conus medullaris and cauda equina: Conus extends to the L1-2 level. Conus and cauda equina appear normal.   Paraspinal and other soft tissues: Negative   Disc levels:   Mild non-compressive disc bulges from T11-12 through L3-4. No stenosis of the canal or foramina.   L4-5: Bilateral facet arthropathy with facet and ligamentous hypertrophy and edematous change. Degenerative anterolisthesis of 2 mm. Bulging of the disc. Mild multifactorial stenosis but without definite neural compression. The facet arthropathy could certainly relate to back pain or referred facet syndrome pain.   L5-S1: Bilateral facet arthropathy with facet and ligamentous hypertrophy and edematous change. Degenerative anterolisthesis of 2 mm. Shallow protrusion of the disc slightly more prominent towards the right. No central canal stenosis. Subarticular lateral recess narrowing on the right which could possibly affect the right S1 nerve. The facet arthropathy would likely be a cause of back pain or referred facet syndrome pain.  IMPRESSION: 1. L4-5: Bilateral facet arthropathy with facet and ligamentous hypertrophy and edematous change. Degenerative anterolisthesis of 2 mm. Bulging of the disc. Mild multifactorial  stenosis but without definite neural compression. The facet arthropathy could certainly relate to back pain or referred facet syndrome pain. Findings have worsened since 2017. 2. L5-S1: Bilateral facet arthropathy with facet and ligamentous hypertrophy and edematous change. Degenerative anterolisthesis of 2 mm. Shallow protrusion of the disc slightly more prominent towards the right. Subarticular lateral recess narrowing on the right which could possibly affect the right S1 nerve. The facet arthropathy would likely be a cause of back pain or referred facet syndrome pain. Findings have worsened since 2017. 3. Newly seen endplate marrow edema at the L1-2 and L5-S1 levels which could relate to regional pain.     Electronically Signed   By: Oneil Officer M.D.   On: 05/27/2023 20:07     Objective:  VS:  HT:    WT:   BMI:     BP:   HR: bpm  TEMP: ( )  RESP:  Physical Exam Vitals and nursing note reviewed.  Constitutional:      General: She is not in acute distress.    Appearance: Normal appearance. She is obese. She is not ill-appearing.  HENT:     Head: Normocephalic and atraumatic.     Right Ear: External ear normal.     Left Ear: External ear normal.  Eyes:     Extraocular Movements: Extraocular movements intact.  Cardiovascular:     Rate and Rhythm: Normal rate.     Pulses: Normal pulses.  Pulmonary:     Effort: Pulmonary effort is normal. No respiratory distress.  Abdominal:     General: There is no distension.     Palpations: Abdomen is soft.  Musculoskeletal:        General: Tenderness present.     Cervical back: Neck supple.     Right lower leg: No edema.     Left lower leg: No edema.     Comments: Patient has good distal strength with  pain over the greater trochanters.  No clonus or focal weakness.  Skin:    Findings: No erythema, lesion or rash.  Neurological:     General: No focal deficit present.     Mental Status: She is alert and oriented to person,  place, and time.     Sensory: No sensory deficit.     Motor: No weakness or abnormal muscle tone.     Coordination: Coordination normal.  Psychiatric:        Mood and Affect: Mood normal.        Behavior: Behavior normal.      Imaging: No results found.

## 2024-02-15 ENCOUNTER — Other Ambulatory Visit: Payer: Self-pay | Admitting: Internal Medicine

## 2024-02-15 DIAGNOSIS — M810 Age-related osteoporosis without current pathological fracture: Secondary | ICD-10-CM

## 2024-02-19 ENCOUNTER — Encounter: Payer: Self-pay | Admitting: Radiology

## 2024-02-29 DIAGNOSIS — E038 Other specified hypothyroidism: Secondary | ICD-10-CM | POA: Diagnosis not present

## 2024-02-29 DIAGNOSIS — I10 Essential (primary) hypertension: Secondary | ICD-10-CM | POA: Diagnosis not present

## 2024-02-29 DIAGNOSIS — R7303 Prediabetes: Secondary | ICD-10-CM | POA: Diagnosis not present

## 2024-02-29 DIAGNOSIS — E559 Vitamin D deficiency, unspecified: Secondary | ICD-10-CM | POA: Diagnosis not present

## 2024-02-29 DIAGNOSIS — E782 Mixed hyperlipidemia: Secondary | ICD-10-CM | POA: Diagnosis not present

## 2024-03-01 LAB — CMP14+EGFR
ALT: 16 IU/L (ref 0–32)
AST: 16 IU/L (ref 0–40)
Albumin: 4.3 g/dL (ref 3.8–4.8)
Alkaline Phosphatase: 48 IU/L — ABNORMAL LOW (ref 49–135)
BUN/Creatinine Ratio: 20 (ref 12–28)
BUN: 16 mg/dL (ref 8–27)
Bilirubin Total: 1.2 mg/dL (ref 0.0–1.2)
CO2: 26 mmol/L (ref 20–29)
Calcium: 9.1 mg/dL (ref 8.7–10.3)
Chloride: 101 mmol/L (ref 96–106)
Creatinine, Ser: 0.81 mg/dL (ref 0.57–1.00)
Globulin, Total: 2.4 g/dL (ref 1.5–4.5)
Glucose: 86 mg/dL (ref 70–99)
Potassium: 3.8 mmol/L (ref 3.5–5.2)
Sodium: 144 mmol/L (ref 134–144)
Total Protein: 6.7 g/dL (ref 6.0–8.5)
eGFR: 78 mL/min/1.73 (ref >59)

## 2024-03-01 LAB — VITAMIN D 25 HYDROXY (VIT D DEFICIENCY, FRACTURES): Vit D, 25-Hydroxy: 71.9 ng/mL (ref 30.0–100.0)

## 2024-03-01 LAB — CBC WITH DIFFERENTIAL/PLATELET
Basophils Absolute: 0 x10E3/uL (ref 0.0–0.2)
Basos: 0 %
EOS (ABSOLUTE): 0.1 x10E3/uL (ref 0.0–0.4)
Eos: 1 %
Hematocrit: 39.9 % (ref 34.0–46.6)
Hemoglobin: 12.7 g/dL (ref 11.1–15.9)
Immature Grans (Abs): 0 x10E3/uL (ref 0.0–0.1)
Immature Granulocytes: 0 %
Lymphocytes Absolute: 2.6 x10E3/uL (ref 0.7–3.1)
Lymphs: 37 %
MCH: 31.7 pg (ref 26.6–33.0)
MCHC: 31.8 g/dL (ref 31.5–35.7)
MCV: 100 fL — ABNORMAL HIGH (ref 79–97)
Monocytes Absolute: 0.6 x10E3/uL (ref 0.1–0.9)
Monocytes: 8 %
Neutrophils Absolute: 3.7 x10E3/uL (ref 1.4–7.0)
Neutrophils: 54 %
Platelets: 288 x10E3/uL (ref 150–450)
RBC: 4.01 x10E6/uL (ref 3.77–5.28)
RDW: 11.5 % — ABNORMAL LOW (ref 11.7–15.4)
WBC: 7 x10E3/uL (ref 3.4–10.8)

## 2024-03-01 LAB — HEMOGLOBIN A1C
Est. average glucose Bld gHb Est-mCnc: 100 mg/dL
Hgb A1c MFr Bld: 5.1 % (ref 4.8–5.6)

## 2024-03-01 LAB — LIPID PANEL
Chol/HDL Ratio: 2.5 ratio (ref 0.0–4.4)
Cholesterol, Total: 199 mg/dL (ref 100–199)
HDL: 80 mg/dL (ref >39)
LDL Chol Calc (NIH): 111 mg/dL — ABNORMAL HIGH (ref 0–99)
Triglycerides: 42 mg/dL (ref 0–149)
VLDL Cholesterol Cal: 8 mg/dL (ref 5–40)

## 2024-03-01 LAB — TSH+FREE T4
Free T4: 1.59 ng/dL (ref 0.82–1.77)
TSH: 3.69 u[IU]/mL (ref 0.450–4.500)

## 2024-03-04 ENCOUNTER — Encounter: Admitting: Family Medicine

## 2024-03-05 ENCOUNTER — Ambulatory Visit (INDEPENDENT_AMBULATORY_CARE_PROVIDER_SITE_OTHER): Admitting: Internal Medicine

## 2024-03-05 ENCOUNTER — Encounter: Payer: Self-pay | Admitting: Internal Medicine

## 2024-03-05 VITALS — BP 134/72 | HR 64 | Ht 66.0 in | Wt 140.6 lb

## 2024-03-05 DIAGNOSIS — E038 Other specified hypothyroidism: Secondary | ICD-10-CM | POA: Diagnosis not present

## 2024-03-05 DIAGNOSIS — Z0001 Encounter for general adult medical examination with abnormal findings: Secondary | ICD-10-CM

## 2024-03-05 DIAGNOSIS — F419 Anxiety disorder, unspecified: Secondary | ICD-10-CM

## 2024-03-05 DIAGNOSIS — N3000 Acute cystitis without hematuria: Secondary | ICD-10-CM

## 2024-03-05 DIAGNOSIS — E782 Mixed hyperlipidemia: Secondary | ICD-10-CM | POA: Diagnosis not present

## 2024-03-05 DIAGNOSIS — I10 Essential (primary) hypertension: Secondary | ICD-10-CM

## 2024-03-05 DIAGNOSIS — R399 Unspecified symptoms and signs involving the genitourinary system: Secondary | ICD-10-CM | POA: Diagnosis not present

## 2024-03-05 MED ORDER — CIPROFLOXACIN HCL 500 MG PO TABS: 500.0000 mg | ORAL_TABLET | Freq: Two times a day (BID) | ORAL | 0 refills | Status: AC

## 2024-03-05 NOTE — Assessment & Plan Note (Signed)
 BP Readings from Last 1 Encounters:  03/05/24 134/72   Well-controlled with losartan  75 mg once daily now Advised to keep log of home BP readings and bring in the next visit Counseled for compliance with the medications Advised DASH diet and moderate exercise/walking, at least 150 mins/week

## 2024-03-05 NOTE — Assessment & Plan Note (Signed)
 Takes Xanax  0.5 - 1 mg QD PRN, PDMP reviewed, refilled Worsening of anxiety or panic attack can also cause elevated high blood pressure - advised to take Xanax  1 mg nightly as needed
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
## 2024-03-05 NOTE — Assessment & Plan Note (Signed)
 Lab Results  Component Value Date   TSH 3.690 02/29/2024   On Levothyroxine  50 mcg QD now Check TSH and free T4

## 2024-03-05 NOTE — Assessment & Plan Note (Signed)
 Check UA with reflex urine culture Started empiric ciprofloxacin  500 mg BID - has history of Pseudomonas infection Advised to maintain adequate hydration

## 2024-03-05 NOTE — Assessment & Plan Note (Signed)
Reviewed last lipid profile Continue to follow low cholesterol diet

## 2024-03-05 NOTE — Assessment & Plan Note (Signed)
 Physical exam as documented. Fasting blood tests reviewed today.

## 2024-03-05 NOTE — Patient Instructions (Signed)
 Please start taking Ciprofloxacin  as prescribed.  Please continue to take medications as prescribed.  Please continue to follow low salt diet and perform moderate exercise/walking as tolerated.

## 2024-03-06 ENCOUNTER — Ambulatory Visit: Payer: Self-pay | Admitting: Internal Medicine

## 2024-03-06 LAB — MICROSCOPIC EXAMINATION
Bacteria, UA: NONE SEEN
Casts: NONE SEEN /LPF
Epithelial Cells (non renal): NONE SEEN /HPF (ref 0–10)
RBC, Urine: NONE SEEN /HPF (ref 0–2)
WBC, UA: NONE SEEN /HPF (ref 0–5)

## 2024-03-06 LAB — UA/M W/RFLX CULTURE, ROUTINE
Bilirubin, UA: NEGATIVE
Glucose, UA: NEGATIVE
Ketones, UA: NEGATIVE
Leukocytes,UA: NEGATIVE
Nitrite, UA: NEGATIVE
Protein,UA: NEGATIVE
RBC, UA: NEGATIVE
Specific Gravity, UA: 1.008 (ref 1.005–1.030)
Urobilinogen, Ur: 0.2 mg/dL (ref 0.2–1.0)
pH, UA: 6.5 (ref 5.0–7.5)

## 2024-03-15 ENCOUNTER — Other Ambulatory Visit: Payer: Self-pay | Admitting: Internal Medicine

## 2024-03-15 DIAGNOSIS — I10 Essential (primary) hypertension: Secondary | ICD-10-CM

## 2024-04-23 ENCOUNTER — Telehealth: Payer: Self-pay

## 2024-04-23 DIAGNOSIS — M7061 Trochanteric bursitis, right hip: Secondary | ICD-10-CM

## 2024-04-23 NOTE — Telephone Encounter (Signed)
 Last injection 10/25 Right hip to knee % 100 relief/function ability Duration of relief 2 months Current pain score--8 No recent falls or injuries--- Same location and pain as last time

## 2024-04-24 ENCOUNTER — Other Ambulatory Visit: Payer: Self-pay | Admitting: Internal Medicine

## 2024-04-24 DIAGNOSIS — F419 Anxiety disorder, unspecified: Secondary | ICD-10-CM

## 2024-04-26 ENCOUNTER — Telehealth: Payer: Self-pay

## 2024-04-26 NOTE — Telephone Encounter (Signed)
 Copied from CRM [id number]. Topic: Appointments - Transfer of Care >> Apr 26, 2024  4:01 PM Tinnie C wrote: Pt is requesting to transfer FROM: Dr. Tobie Pt is requesting to transfer TO: Dr. Aletha Reason for requested transfer: Pt was previously established with [facility] and liked the location.  It is the responsibility of the team the patient would like to transfer to (Dr. Aletha) to reach out to the patient if for any reason this transfer is not acceptable.

## 2024-04-30 ENCOUNTER — Other Ambulatory Visit: Payer: Self-pay

## 2024-04-30 ENCOUNTER — Ambulatory Visit: Admitting: Physical Medicine and Rehabilitation

## 2024-04-30 DIAGNOSIS — M7061 Trochanteric bursitis, right hip: Secondary | ICD-10-CM | POA: Diagnosis not present

## 2024-04-30 DIAGNOSIS — G8929 Other chronic pain: Secondary | ICD-10-CM

## 2024-04-30 MED ORDER — METHYLPREDNISOLONE ACETATE 40 MG/ML IJ SUSP: 40.0000 mg | Freq: Once | INTRAMUSCULAR | Status: DC

## 2024-04-30 NOTE — Progress Notes (Signed)
 "  Courtney Tapia - 74 y.o. female MRN [id number]  Date of birth: 06-01-1950  Office Visit Note: Visit Date: 04/30/2024 PCP: Tobie Suzzane POUR, MD Referred by: Tobie Suzzane POUR, MD  Subjective: Chief Complaint  Patient presents with   Right Hip - Pain   HPI:  Courtney Tapia is a 74 y.o. female who comes in today at the request of Duwaine Pouch, FNP for planned Right greater trochanteric injection with fluoroscopic guidance.  The patient has failed conservative care including home exercise, medications, time and activity modification.  This injection will be diagnostic and hopefully therapeutic.  Please see requesting physician notes for further details and justification.   ROS Otherwise per HPI.  Assessment & Plan: Visit Diagnoses:    ICD-10-CM   1. Greater trochanteric bursitis, right  M70.61 Large Joint Inj: R greater trochanter    XR C-ARM NO REPORT    DISCONTINUED: methylPREDNISolone  acetate (DEPO-MEDROL ) injection 40 mg    2. Chronic pain of right knee  M25.561 Ambulatory referral to Sports Medicine   G89.29       Plan: No additional findings.   Meds & Orders:  Meds ordered this encounter  Medications   DISCONTD: methylPREDNISolone  acetate (DEPO-MEDROL ) injection 40 mg    Orders Placed This Encounter  Procedures   Large Joint Inj: R greater trochanter   XR C-ARM NO REPORT   Ambulatory referral to Sports Medicine    Follow-up: Return for visit to requesting provider as needed.   Procedures: Large Joint Inj: R greater trochanter on 04/30/2024 1:08 PM Indications: pain and diagnostic evaluation Details: 22 G 3.5 in needle, fluoroscopy-guided lateral approach  Arthrogram: No  Medications: 4 mL lidocaine  2 %; 4 mL bupivacaine  0.25 %; 40 mg triamcinolone  acetonide 40 MG/ML Outcome: tolerated well, no immediate complications  There was excellent flow of contrast outlined the greater trochanteric bursa without vascular uptake. Procedure, treatment alternatives, risks  and benefits explained, specific risks discussed. Consent was given by the patient. Immediately prior to procedure a time out was called to verify the correct patient, procedure, equipment, support staff and site/side marked as required. Patient was prepped and draped in the usual sterile fashion.          Clinical History: Narrative & Impression CLINICAL DATA:  Lumbar radiculopathy, symptoms persist with greater than 6 weeks of treatment.   EXAM: MRI LUMBAR SPINE WITHOUT CONTRAST   TECHNIQUE: Multiplanar, multisequence MR imaging of the lumbar spine was performed. No intravenous contrast was administered.   COMPARISON:  Radiography 04/17/2023.  MRI 09/25/2015.   FINDINGS: Segmentation: 5 lumbar type vertebral bodies as numbered previously.   Alignment:  2 mm degenerative anterolisthesis L4-5 and L5-S1.   Vertebrae: Newly seen endplate marrow edema at the L1-2 and L5-S1 levels which could relate to regional pain. Worsened edematous change of the facet joints at L4-5 and L5-S1 likely to be painful.   Conus medullaris and cauda equina: Conus extends to the L1-2 level. Conus and cauda equina appear normal.   Paraspinal and other soft tissues: Negative   Disc levels:   Mild non-compressive disc bulges from T11-12 through L3-4. No stenosis of the canal or foramina.   L4-5: Bilateral facet arthropathy with facet and ligamentous hypertrophy and edematous change. Degenerative anterolisthesis of 2 mm. Bulging of the disc. Mild multifactorial stenosis but without definite neural compression. The facet arthropathy could certainly relate to back pain or referred facet syndrome pain.   L5-S1: Bilateral facet arthropathy with facet and ligamentous hypertrophy and  edematous change. Degenerative anterolisthesis of 2 mm. Shallow protrusion of the disc slightly more prominent towards the right. No central canal stenosis. Subarticular lateral recess narrowing on the right which could  possibly affect the right S1 nerve. The facet arthropathy would likely be a cause of back pain or referred facet syndrome pain.   IMPRESSION: 1. L4-5: Bilateral facet arthropathy with facet and ligamentous hypertrophy and edematous change. Degenerative anterolisthesis of 2 mm. Bulging of the disc. Mild multifactorial stenosis but without definite neural compression. The facet arthropathy could certainly relate to back pain or referred facet syndrome pain. Findings have worsened since 2017. 2. L5-S1: Bilateral facet arthropathy with facet and ligamentous hypertrophy and edematous change. Degenerative anterolisthesis of 2 mm. Shallow protrusion of the disc slightly more prominent towards the right. Subarticular lateral recess narrowing on the right which could possibly affect the right S1 nerve. The facet arthropathy would likely be a cause of back pain or referred facet syndrome pain. Findings have worsened since 2017. 3. Newly seen endplate marrow edema at the L1-2 and L5-S1 levels which could relate to regional pain.     Electronically Signed   By: Oneil Officer M.D.   On: 05/27/2023 20:07     Objective:  VS:  HT:    WT:   BMI:     BP:   HR: bpm  TEMP: ( )  RESP:  Physical Exam   Imaging: No results found. "

## 2024-04-30 NOTE — Progress Notes (Signed)
 Pain Scale   Average Pain 4 Patient advising she has chronic right hip pain, pain comes and goes.        +Driver, -BT, -Dye Allergies.

## 2024-05-10 ENCOUNTER — Encounter: Admitting: Sports Medicine

## 2024-05-12 MED ORDER — BUPIVACAINE HCL 0.25 % IJ SOLN
4.0000 mL | INTRAMUSCULAR | Status: AC | PRN
  Administered 2024-04-30: 4 mL via INTRA_ARTICULAR

## 2024-05-12 MED ORDER — LIDOCAINE HCL 2 % IJ SOLN
4.0000 mL | INTRAMUSCULAR | Status: AC | PRN
  Administered 2024-04-30: 4 mL

## 2024-05-12 MED ORDER — TRIAMCINOLONE ACETONIDE 40 MG/ML IJ SUSP
40.0000 mg | INTRAMUSCULAR | Status: AC | PRN
  Administered 2024-04-30: 40 mg via INTRA_ARTICULAR

## 2024-05-21 ENCOUNTER — Encounter: Admitting: Sports Medicine

## 2024-05-22 ENCOUNTER — Encounter: Payer: Self-pay | Admitting: *Deleted

## 2024-05-22 NOTE — Progress Notes (Signed)
 DONISHA HOCH                                          MRN: [id number]   05/22/2024   The [facility] Quality Team Specialist reviewed this patient medical record for the purposes of chart review for care gap closure. The following were reviewed: abstraction for care gap closure-controlling blood pressure.    [facility] Quality Team

## 2024-06-05 ENCOUNTER — Encounter: Admitting: Sports Medicine

## 2024-06-18 ENCOUNTER — Encounter: Admitting: Family Medicine

## 2024-07-03 ENCOUNTER — Ambulatory Visit: Admitting: Internal Medicine

## 2024-08-27 ENCOUNTER — Encounter: Admitting: Family Medicine

## 2025-01-27 ENCOUNTER — Ambulatory Visit

## 2025-01-31 ENCOUNTER — Ambulatory Visit
